# Patient Record
Sex: Female | Born: 1945
Health system: Southern US, Community
[De-identification: ages and names within clinical notes are randomized; demographics above are authoritative.]

## PROBLEM LIST (undated history)

## (undated) DIAGNOSIS — M1990 Unspecified osteoarthritis, unspecified site: Secondary | ICD-10-CM

## (undated) DIAGNOSIS — D649 Anemia, unspecified: Secondary | ICD-10-CM

## (undated) DIAGNOSIS — I10 Essential (primary) hypertension: Secondary | ICD-10-CM

## (undated) DIAGNOSIS — C801 Malignant (primary) neoplasm, unspecified: Secondary | ICD-10-CM

## (undated) HISTORY — PX: EYE SURGERY: SHX253

## (undated) HISTORY — PX: TUBAL LIGATION: SHX77

## (undated) HISTORY — PX: CATARACT EXTRACTION W/ INTRAOCULAR LENS IMPLANT: SHX1309

## (undated) HISTORY — PX: BUNIONECTOMY: SHX129

---

## 1998-12-04 ENCOUNTER — Emergency Department (HOSPITAL_COMMUNITY): Admission: EM | Admit: 1998-12-04 | Discharge: 1998-12-04 | Payer: Self-pay | Admitting: Emergency Medicine

## 1999-11-22 ENCOUNTER — Encounter: Admission: RE | Admit: 1999-11-22 | Discharge: 1999-11-22 | Payer: Self-pay | Admitting: Family Medicine

## 1999-11-22 ENCOUNTER — Encounter: Payer: Self-pay | Admitting: Family Medicine

## 1999-12-18 ENCOUNTER — Other Ambulatory Visit: Admission: RE | Admit: 1999-12-18 | Discharge: 1999-12-18 | Payer: Self-pay | Admitting: *Deleted

## 1999-12-31 ENCOUNTER — Encounter: Admission: RE | Admit: 1999-12-31 | Discharge: 1999-12-31 | Payer: Self-pay | Admitting: Obstetrics and Gynecology

## 1999-12-31 ENCOUNTER — Encounter: Payer: Self-pay | Admitting: Obstetrics and Gynecology

## 2001-04-30 ENCOUNTER — Emergency Department (HOSPITAL_COMMUNITY): Admission: EM | Admit: 2001-04-30 | Discharge: 2001-04-30 | Payer: Self-pay | Admitting: Emergency Medicine

## 2002-01-25 ENCOUNTER — Other Ambulatory Visit: Admission: RE | Admit: 2002-01-25 | Discharge: 2002-01-25 | Payer: Self-pay | Admitting: Obstetrics and Gynecology

## 2003-02-16 ENCOUNTER — Other Ambulatory Visit: Admission: RE | Admit: 2003-02-16 | Discharge: 2003-02-16 | Payer: Self-pay | Admitting: Obstetrics and Gynecology

## 2004-05-17 ENCOUNTER — Other Ambulatory Visit: Admission: RE | Admit: 2004-05-17 | Discharge: 2004-05-17 | Payer: Self-pay | Admitting: Obstetrics and Gynecology

## 2005-07-24 ENCOUNTER — Other Ambulatory Visit: Admission: RE | Admit: 2005-07-24 | Discharge: 2005-07-24 | Payer: Self-pay | Admitting: Obstetrics and Gynecology

## 2005-11-20 ENCOUNTER — Encounter: Admission: RE | Admit: 2005-11-20 | Discharge: 2005-11-20 | Payer: Self-pay | Admitting: Emergency Medicine

## 2006-07-28 ENCOUNTER — Other Ambulatory Visit: Admission: RE | Admit: 2006-07-28 | Discharge: 2006-07-28 | Payer: Self-pay | Admitting: Obstetrics and Gynecology

## 2016-07-03 DIAGNOSIS — B880 Other acariasis: Secondary | ICD-10-CM | POA: Diagnosis not present

## 2016-08-08 DIAGNOSIS — N3001 Acute cystitis with hematuria: Secondary | ICD-10-CM | POA: Diagnosis not present

## 2016-08-08 DIAGNOSIS — R030 Elevated blood-pressure reading, without diagnosis of hypertension: Secondary | ICD-10-CM | POA: Diagnosis not present

## 2016-08-08 DIAGNOSIS — B373 Candidiasis of vulva and vagina: Secondary | ICD-10-CM | POA: Diagnosis not present

## 2016-09-01 DIAGNOSIS — I10 Essential (primary) hypertension: Secondary | ICD-10-CM | POA: Diagnosis not present

## 2016-09-01 DIAGNOSIS — Z23 Encounter for immunization: Secondary | ICD-10-CM | POA: Diagnosis not present

## 2016-09-30 DIAGNOSIS — I10 Essential (primary) hypertension: Secondary | ICD-10-CM | POA: Diagnosis not present

## 2016-10-31 DIAGNOSIS — Z08 Encounter for follow-up examination after completed treatment for malignant neoplasm: Secondary | ICD-10-CM | POA: Diagnosis not present

## 2016-10-31 DIAGNOSIS — Z1283 Encounter for screening for malignant neoplasm of skin: Secondary | ICD-10-CM | POA: Diagnosis not present

## 2016-10-31 DIAGNOSIS — L821 Other seborrheic keratosis: Secondary | ICD-10-CM | POA: Diagnosis not present

## 2016-10-31 DIAGNOSIS — Z85820 Personal history of malignant melanoma of skin: Secondary | ICD-10-CM | POA: Diagnosis not present

## 2017-01-08 DIAGNOSIS — Z961 Presence of intraocular lens: Secondary | ICD-10-CM | POA: Diagnosis not present

## 2017-11-05 DIAGNOSIS — Z23 Encounter for immunization: Secondary | ICD-10-CM | POA: Diagnosis not present

## 2017-11-20 DIAGNOSIS — Z08 Encounter for follow-up examination after completed treatment for malignant neoplasm: Secondary | ICD-10-CM | POA: Diagnosis not present

## 2017-11-20 DIAGNOSIS — Z85820 Personal history of malignant melanoma of skin: Secondary | ICD-10-CM | POA: Diagnosis not present

## 2017-11-20 DIAGNOSIS — L570 Actinic keratosis: Secondary | ICD-10-CM | POA: Diagnosis not present

## 2017-11-20 DIAGNOSIS — Z1283 Encounter for screening for malignant neoplasm of skin: Secondary | ICD-10-CM | POA: Diagnosis not present

## 2017-11-20 DIAGNOSIS — L820 Inflamed seborrheic keratosis: Secondary | ICD-10-CM | POA: Diagnosis not present

## 2017-11-20 DIAGNOSIS — X32XXXD Exposure to sunlight, subsequent encounter: Secondary | ICD-10-CM | POA: Diagnosis not present

## 2018-01-13 DIAGNOSIS — H5053 Vertical heterophoria: Secondary | ICD-10-CM | POA: Diagnosis not present

## 2018-04-20 DIAGNOSIS — R030 Elevated blood-pressure reading, without diagnosis of hypertension: Secondary | ICD-10-CM | POA: Diagnosis not present

## 2018-05-04 DIAGNOSIS — Z23 Encounter for immunization: Secondary | ICD-10-CM | POA: Diagnosis not present

## 2018-05-04 DIAGNOSIS — R030 Elevated blood-pressure reading, without diagnosis of hypertension: Secondary | ICD-10-CM | POA: Diagnosis not present

## 2018-05-04 DIAGNOSIS — Z5320 Procedure and treatment not carried out because of patient's decision for unspecified reasons: Secondary | ICD-10-CM | POA: Diagnosis not present

## 2018-08-12 DIAGNOSIS — W19XXXA Unspecified fall, initial encounter: Secondary | ICD-10-CM | POA: Diagnosis not present

## 2018-08-12 DIAGNOSIS — S82832A Other fracture of upper and lower end of left fibula, initial encounter for closed fracture: Secondary | ICD-10-CM | POA: Diagnosis not present

## 2018-08-12 DIAGNOSIS — S93409A Sprain of unspecified ligament of unspecified ankle, initial encounter: Secondary | ICD-10-CM | POA: Diagnosis not present

## 2018-08-17 DIAGNOSIS — S82832D Other fracture of upper and lower end of left fibula, subsequent encounter for closed fracture with routine healing: Secondary | ICD-10-CM | POA: Diagnosis not present

## 2018-08-23 DIAGNOSIS — M25572 Pain in left ankle and joints of left foot: Secondary | ICD-10-CM | POA: Diagnosis not present

## 2018-08-26 DIAGNOSIS — M25572 Pain in left ankle and joints of left foot: Secondary | ICD-10-CM | POA: Diagnosis not present

## 2018-08-30 DIAGNOSIS — M25572 Pain in left ankle and joints of left foot: Secondary | ICD-10-CM | POA: Diagnosis not present

## 2018-09-03 DIAGNOSIS — M25572 Pain in left ankle and joints of left foot: Secondary | ICD-10-CM | POA: Diagnosis not present

## 2018-09-06 DIAGNOSIS — M25572 Pain in left ankle and joints of left foot: Secondary | ICD-10-CM | POA: Diagnosis not present

## 2018-09-09 DIAGNOSIS — M25579 Pain in unspecified ankle and joints of unspecified foot: Secondary | ICD-10-CM | POA: Diagnosis not present

## 2018-09-14 DIAGNOSIS — M25572 Pain in left ankle and joints of left foot: Secondary | ICD-10-CM | POA: Diagnosis not present

## 2018-09-16 DIAGNOSIS — M25572 Pain in left ankle and joints of left foot: Secondary | ICD-10-CM | POA: Diagnosis not present

## 2018-09-21 DIAGNOSIS — S82832D Other fracture of upper and lower end of left fibula, subsequent encounter for closed fracture with routine healing: Secondary | ICD-10-CM | POA: Diagnosis not present

## 2018-09-24 DIAGNOSIS — M25572 Pain in left ankle and joints of left foot: Secondary | ICD-10-CM | POA: Diagnosis not present

## 2018-09-27 DIAGNOSIS — M25572 Pain in left ankle and joints of left foot: Secondary | ICD-10-CM | POA: Diagnosis not present

## 2018-09-29 DIAGNOSIS — M25572 Pain in left ankle and joints of left foot: Secondary | ICD-10-CM | POA: Diagnosis not present

## 2018-10-04 DIAGNOSIS — M25572 Pain in left ankle and joints of left foot: Secondary | ICD-10-CM | POA: Diagnosis not present

## 2018-10-06 DIAGNOSIS — M25572 Pain in left ankle and joints of left foot: Secondary | ICD-10-CM | POA: Diagnosis not present

## 2018-10-11 DIAGNOSIS — M25579 Pain in unspecified ankle and joints of unspecified foot: Secondary | ICD-10-CM | POA: Diagnosis not present

## 2018-10-12 DIAGNOSIS — Z23 Encounter for immunization: Secondary | ICD-10-CM | POA: Diagnosis not present

## 2018-11-23 DIAGNOSIS — Z08 Encounter for follow-up examination after completed treatment for malignant neoplasm: Secondary | ICD-10-CM | POA: Diagnosis not present

## 2018-11-23 DIAGNOSIS — Z1283 Encounter for screening for malignant neoplasm of skin: Secondary | ICD-10-CM | POA: Diagnosis not present

## 2018-11-23 DIAGNOSIS — Z85820 Personal history of malignant melanoma of skin: Secondary | ICD-10-CM | POA: Diagnosis not present

## 2018-11-23 DIAGNOSIS — D225 Melanocytic nevi of trunk: Secondary | ICD-10-CM | POA: Diagnosis not present

## 2019-01-21 DIAGNOSIS — M25572 Pain in left ankle and joints of left foot: Secondary | ICD-10-CM | POA: Diagnosis not present

## 2019-01-21 DIAGNOSIS — M25571 Pain in right ankle and joints of right foot: Secondary | ICD-10-CM | POA: Diagnosis not present

## 2019-04-27 DIAGNOSIS — Z961 Presence of intraocular lens: Secondary | ICD-10-CM | POA: Diagnosis not present

## 2019-05-05 ENCOUNTER — Other Ambulatory Visit: Payer: Self-pay | Admitting: Family Medicine

## 2019-05-05 DIAGNOSIS — Z1231 Encounter for screening mammogram for malignant neoplasm of breast: Secondary | ICD-10-CM

## 2019-05-09 ENCOUNTER — Ambulatory Visit
Admission: RE | Admit: 2019-05-09 | Discharge: 2019-05-09 | Disposition: A | Discharge status: Home / Self Care | Payer: Medicare Other | Source: Ambulatory Visit | Attending: Family Medicine | Admitting: Family Medicine

## 2019-05-09 ENCOUNTER — Other Ambulatory Visit: Payer: Self-pay

## 2019-05-09 DIAGNOSIS — Z1231 Encounter for screening mammogram for malignant neoplasm of breast: Secondary | ICD-10-CM | POA: Diagnosis not present

## 2019-06-01 DIAGNOSIS — Z1211 Encounter for screening for malignant neoplasm of colon: Secondary | ICD-10-CM | POA: Diagnosis not present

## 2019-06-01 DIAGNOSIS — Z13220 Encounter for screening for lipoid disorders: Secondary | ICD-10-CM | POA: Diagnosis not present

## 2019-06-01 DIAGNOSIS — Z23 Encounter for immunization: Secondary | ICD-10-CM | POA: Diagnosis not present

## 2019-06-01 DIAGNOSIS — Z136 Encounter for screening for cardiovascular disorders: Secondary | ICD-10-CM | POA: Diagnosis not present

## 2019-06-01 DIAGNOSIS — M25511 Pain in right shoulder: Secondary | ICD-10-CM | POA: Diagnosis not present

## 2019-06-01 DIAGNOSIS — Z0000 Encounter for general adult medical examination without abnormal findings: Secondary | ICD-10-CM | POA: Diagnosis not present

## 2019-06-16 DIAGNOSIS — M25511 Pain in right shoulder: Secondary | ICD-10-CM | POA: Diagnosis not present

## 2019-08-08 DIAGNOSIS — Z79899 Other long term (current) drug therapy: Secondary | ICD-10-CM | POA: Diagnosis not present

## 2019-08-08 DIAGNOSIS — E7800 Pure hypercholesterolemia, unspecified: Secondary | ICD-10-CM | POA: Diagnosis not present

## 2019-09-02 DIAGNOSIS — I872 Venous insufficiency (chronic) (peripheral): Secondary | ICD-10-CM | POA: Diagnosis not present

## 2019-11-04 DIAGNOSIS — E7800 Pure hypercholesterolemia, unspecified: Secondary | ICD-10-CM | POA: Diagnosis not present

## 2019-11-04 DIAGNOSIS — Z79899 Other long term (current) drug therapy: Secondary | ICD-10-CM | POA: Diagnosis not present

## 2019-12-13 DIAGNOSIS — L820 Inflamed seborrheic keratosis: Secondary | ICD-10-CM | POA: Diagnosis not present

## 2019-12-13 DIAGNOSIS — D225 Melanocytic nevi of trunk: Secondary | ICD-10-CM | POA: Diagnosis not present

## 2019-12-13 DIAGNOSIS — Z85820 Personal history of malignant melanoma of skin: Secondary | ICD-10-CM | POA: Diagnosis not present

## 2019-12-13 DIAGNOSIS — Z1283 Encounter for screening for malignant neoplasm of skin: Secondary | ICD-10-CM | POA: Diagnosis not present

## 2019-12-13 DIAGNOSIS — Z08 Encounter for follow-up examination after completed treatment for malignant neoplasm: Secondary | ICD-10-CM | POA: Diagnosis not present

## 2020-01-24 DIAGNOSIS — C44319 Basal cell carcinoma of skin of other parts of face: Secondary | ICD-10-CM | POA: Diagnosis not present

## 2020-02-21 DIAGNOSIS — Z1283 Encounter for screening for malignant neoplasm of skin: Secondary | ICD-10-CM | POA: Diagnosis not present

## 2020-02-21 DIAGNOSIS — L905 Scar conditions and fibrosis of skin: Secondary | ICD-10-CM | POA: Diagnosis not present

## 2020-02-21 DIAGNOSIS — D485 Neoplasm of uncertain behavior of skin: Secondary | ICD-10-CM | POA: Diagnosis not present

## 2020-02-23 ENCOUNTER — Ambulatory Visit: Payer: Medicare Other | Attending: Internal Medicine

## 2020-02-23 DIAGNOSIS — Z23 Encounter for immunization: Secondary | ICD-10-CM

## 2020-02-23 NOTE — Progress Notes (Signed)
   Covid-19 Vaccination Clinic  Name:  Kristin Richmond    MRN: [id number] DOB: Jul 11, 1946  02/23/2020  [name] was observed post Covid-19 immunization for 15 minutes without incident. She was provided with Vaccine Information Sheet and instruction to access the V-Safe system.   [name] was instructed to call 911 with any severe reactions post vaccine: Marland Kitchen Difficulty breathing  . Swelling of face and throat  . A fast heartbeat  . A bad rash all over body  . Dizziness and weakness   Immunizations Administered    Name Date Dose VIS Date Route   Pfizer COVID-19 Vaccine 02/23/2020  9:53 AM 0.3 mL 10/28/2019 Intramuscular   Manufacturer: [place]   Lot: [id number]   [place]: [id number]

## 2020-03-19 ENCOUNTER — Ambulatory Visit: Payer: Medicare Other | Attending: Internal Medicine

## 2020-03-19 DIAGNOSIS — Z23 Encounter for immunization: Secondary | ICD-10-CM

## 2020-03-19 NOTE — Progress Notes (Signed)
   Covid-19 Vaccination Clinic  Name:  Kristin Richmond    MRN: [id number] DOB: 01-15-1946  03/19/2020  Kristin Richmond was observed post Covid-19 immunization for 15 minutes without incident. She was provided with Vaccine Information Sheet and instruction to access the V-Safe system.   Kristin Richmond was instructed to call 911 with any severe reactions post vaccine: Marland Kitchen Difficulty breathing  . Swelling of face and throat  . A fast heartbeat  . A bad rash all over body  . Dizziness and weakness   Immunizations Administered    Name Date Dose VIS Date Route   Pfizer COVID-19 Vaccine 03/19/2020  8:51 AM 0.3 mL 01/11/2019 Intramuscular   Manufacturer: [place]   Lot: [id number]   [place]: [id number]

## 2020-03-27 DIAGNOSIS — Z08 Encounter for follow-up examination after completed treatment for malignant neoplasm: Secondary | ICD-10-CM | POA: Diagnosis not present

## 2020-03-27 DIAGNOSIS — Z85828 Personal history of other malignant neoplasm of skin: Secondary | ICD-10-CM | POA: Diagnosis not present

## 2020-04-25 ENCOUNTER — Other Ambulatory Visit: Payer: Self-pay | Admitting: Family Medicine

## 2020-04-25 DIAGNOSIS — Z1231 Encounter for screening mammogram for malignant neoplasm of breast: Secondary | ICD-10-CM

## 2020-05-03 DIAGNOSIS — H26491 Other secondary cataract, right eye: Secondary | ICD-10-CM | POA: Diagnosis not present

## 2020-05-03 DIAGNOSIS — Z961 Presence of intraocular lens: Secondary | ICD-10-CM | POA: Diagnosis not present

## 2020-05-04 DIAGNOSIS — M19011 Primary osteoarthritis, right shoulder: Secondary | ICD-10-CM | POA: Diagnosis not present

## 2020-05-04 DIAGNOSIS — M25511 Pain in right shoulder: Secondary | ICD-10-CM | POA: Diagnosis not present

## 2020-05-31 ENCOUNTER — Ambulatory Visit: Payer: Medicare Other

## 2020-05-31 ENCOUNTER — Other Ambulatory Visit: Payer: Self-pay

## 2020-05-31 ENCOUNTER — Ambulatory Visit
Admission: RE | Admit: 2020-05-31 | Discharge: 2020-05-31 | Disposition: A | Discharge status: Home / Self Care | Payer: Medicare Other | Source: Ambulatory Visit | Attending: Family Medicine | Admitting: Family Medicine

## 2020-05-31 DIAGNOSIS — Z1231 Encounter for screening mammogram for malignant neoplasm of breast: Secondary | ICD-10-CM | POA: Diagnosis not present

## 2020-06-01 DIAGNOSIS — Z136 Encounter for screening for cardiovascular disorders: Secondary | ICD-10-CM | POA: Diagnosis not present

## 2020-06-01 DIAGNOSIS — Z23 Encounter for immunization: Secondary | ICD-10-CM | POA: Diagnosis not present

## 2020-06-01 DIAGNOSIS — Z1211 Encounter for screening for malignant neoplasm of colon: Secondary | ICD-10-CM | POA: Diagnosis not present

## 2020-06-01 DIAGNOSIS — Z0000 Encounter for general adult medical examination without abnormal findings: Secondary | ICD-10-CM | POA: Diagnosis not present

## 2020-06-11 DIAGNOSIS — Z1211 Encounter for screening for malignant neoplasm of colon: Secondary | ICD-10-CM | POA: Diagnosis not present

## 2020-07-05 DIAGNOSIS — M25511 Pain in right shoulder: Secondary | ICD-10-CM | POA: Diagnosis not present

## 2020-07-05 DIAGNOSIS — M19011 Primary osteoarthritis, right shoulder: Secondary | ICD-10-CM | POA: Diagnosis not present

## 2020-07-10 DIAGNOSIS — Z85828 Personal history of other malignant neoplasm of skin: Secondary | ICD-10-CM | POA: Diagnosis not present

## 2020-07-10 DIAGNOSIS — Z08 Encounter for follow-up examination after completed treatment for malignant neoplasm: Secondary | ICD-10-CM | POA: Diagnosis not present

## 2020-08-23 DIAGNOSIS — Z23 Encounter for immunization: Secondary | ICD-10-CM | POA: Diagnosis not present

## 2020-11-02 DIAGNOSIS — Z23 Encounter for immunization: Secondary | ICD-10-CM | POA: Diagnosis not present

## 2021-01-16 DIAGNOSIS — M25572 Pain in left ankle and joints of left foot: Secondary | ICD-10-CM | POA: Diagnosis not present

## 2021-01-16 DIAGNOSIS — M25561 Pain in right knee: Secondary | ICD-10-CM | POA: Diagnosis not present

## 2021-01-31 DIAGNOSIS — M25572 Pain in left ankle and joints of left foot: Secondary | ICD-10-CM | POA: Diagnosis not present

## 2021-03-12 DIAGNOSIS — M19011 Primary osteoarthritis, right shoulder: Secondary | ICD-10-CM | POA: Diagnosis not present

## 2021-04-05 DIAGNOSIS — M19011 Primary osteoarthritis, right shoulder: Secondary | ICD-10-CM | POA: Diagnosis not present

## 2021-04-08 ENCOUNTER — Other Ambulatory Visit: Payer: Self-pay | Admitting: Orthopedic Surgery

## 2021-04-08 DIAGNOSIS — M19011 Primary osteoarthritis, right shoulder: Secondary | ICD-10-CM

## 2021-04-19 ENCOUNTER — Other Ambulatory Visit: Payer: Self-pay | Admitting: Family Medicine

## 2021-04-19 DIAGNOSIS — Z1231 Encounter for screening mammogram for malignant neoplasm of breast: Secondary | ICD-10-CM

## 2021-04-22 ENCOUNTER — Ambulatory Visit
Admission: RE | Admit: 2021-04-22 | Discharge: 2021-04-22 | Disposition: A | Discharge status: Home / Self Care | Payer: Medicare Other | Source: Ambulatory Visit | Attending: Orthopedic Surgery | Admitting: Orthopedic Surgery

## 2021-04-22 DIAGNOSIS — Z01818 Encounter for other preprocedural examination: Secondary | ICD-10-CM | POA: Diagnosis not present

## 2021-04-22 DIAGNOSIS — M19011 Primary osteoarthritis, right shoulder: Secondary | ICD-10-CM

## 2021-04-22 DIAGNOSIS — M25711 Osteophyte, right shoulder: Secondary | ICD-10-CM | POA: Diagnosis not present

## 2021-05-10 DIAGNOSIS — Z01810 Encounter for preprocedural cardiovascular examination: Secondary | ICD-10-CM | POA: Diagnosis not present

## 2021-05-10 DIAGNOSIS — M19011 Primary osteoarthritis, right shoulder: Secondary | ICD-10-CM | POA: Diagnosis not present

## 2021-05-10 DIAGNOSIS — E782 Mixed hyperlipidemia: Secondary | ICD-10-CM | POA: Diagnosis not present

## 2021-05-10 DIAGNOSIS — Z1211 Encounter for screening for malignant neoplasm of colon: Secondary | ICD-10-CM | POA: Diagnosis not present

## 2021-05-21 DIAGNOSIS — D225 Melanocytic nevi of trunk: Secondary | ICD-10-CM | POA: Diagnosis not present

## 2021-05-21 DIAGNOSIS — Z08 Encounter for follow-up examination after completed treatment for malignant neoplasm: Secondary | ICD-10-CM | POA: Diagnosis not present

## 2021-05-21 DIAGNOSIS — Z85828 Personal history of other malignant neoplasm of skin: Secondary | ICD-10-CM | POA: Diagnosis not present

## 2021-05-21 DIAGNOSIS — Z1283 Encounter for screening for malignant neoplasm of skin: Secondary | ICD-10-CM | POA: Diagnosis not present

## 2021-05-21 DIAGNOSIS — Z85820 Personal history of malignant melanoma of skin: Secondary | ICD-10-CM | POA: Diagnosis not present

## 2021-06-03 NOTE — Patient Instructions (Addendum)
DUE TO COVID-19 ONLY ONE VISITOR IS ALLOWED TO COME WITH YOU AND STAY IN THE WAITING ROOM ONLY DURING PRE OP AND PROCEDURE DAY OF SURGERY. THE 1 VISITOR  MAY VISIT WITH YOU AFTER SURGERY IN YOUR PRIVATE ROOM DURING VISITING HOURS ONLY!  YOU NEED TO HAVE A COVID 19 TEST ON: 06/11/21 @ 9:00 AM , THIS TEST MUST BE DONE BEFORE SURGERY,  COVID TESTING SITE [place] [id number], IT IS ON THE RIGHT GOING OUT [address] APPROXIMATELY  2 MINUTES PAST [employer] ON THE RIGHT. ONCE YOUR COVID TEST IS COMPLETED,  PLEASE BEGIN THE QUARANTINE INSTRUCTIONS AS OUTLINED IN YOUR HANDOUT.                Kristin Richmond   Your procedure is scheduled on: 06/14/21   Report to [facility] Main  Entrance   Report to admitting at : 10:00 AM    Call this number if you have problems the morning of surgery [phone]    Remember: NO SOLID FOOD AFTER MIDNIGHT THE NIGHT PRIOR TO SURGERY. NOTHING BY MOUTH EXCEPT CLEAR LIQUIDS UNTIL: 9:30 AM . PLEASE FINISH ENSURE DRINK PER SURGEON ORDER  WHICH NEEDS TO BE COMPLETED AT : 9:30 AM.   CLEAR LIQUID DIET  Foods Allowed                                                                     Foods Excluded  Coffee and tea, regular and decaf                             liquids that you cannot  Plain Jell-O any favor except red or purple                                           see through such as: Fruit ices (not with fruit pulp)                                     milk, soups, orange juice  Iced Popsicles                                    All solid food Carbonated beverages, regular and diet                                    Cranberry, grape and [place] juices Sports drinks like Gatorade Lightly seasoned clear broth or consume(fat free) Sugar, honey syrup  Sample Menu Breakfast                                Lunch  Supper Cranberry juice                    Beef broth                             Chicken broth Jell-O                                     Grape juice                           Dimitrov juice Coffee or tea                        Jell-O                                      Popsicle                                                Coffee or tea                        Coffee or tea  _____________________________________________________________________   BRUSH YOUR TEETH MORNING OF SURGERY AND RINSE YOUR MOUTH OUT, NO CHEWING GUM CANDY OR MINTS.    Take these medicines the morning of surgery with A SIP OF WATER: N/A                                You may not have any metal on your body including hair pins and              piercings  Do not wear jewelry, make-up, lotions, powders or perfumes, deodorant             Do not wear nail polish on your fingernails.  Do not shave  48 hours prior to surgery.    Do not bring valuables to the hospital. [place].  Contacts, dentures or bridgework may not be worn into surgery.  Leave suitcase in the car. After surgery it may be brought to your room.     Patients discharged the day of surgery will not be allowed to drive home. IF YOU ARE HAVING SURGERY AND GOING HOME THE SAME DAY, YOU MUST HAVE AN ADULT TO DRIVE YOU HOME AND BE WITH YOU FOR 24 HOURS. YOU MAY GO HOME BY TAXI OR UBER OR ORTHERWISE, BUT AN ADULT MUST ACCOMPANY YOU HOME AND STAY WITH YOU FOR 24 HOURS.  Name and phone number of your driver:  Special Instructions: N/A              Please read over the following fact sheets you were given: _____________________________________________________________________           [facility] - Preparing for Surgery Before surgery, you can play an important role.  Because skin is not sterile, your skin needs to be as free of germs as possible.  You can reduce the number of germs on your skin by washing with CHG (chlorahexidine gluconate) soap before surgery.  CHG is an antiseptic cleaner  which kills germs and bonds with the skin to continue killing germs even after washing. Please DO NOT use if you have an allergy to CHG or antibacterial soaps.  If your skin becomes reddened/irritated stop using the CHG and inform your nurse when you arrive at Short Stay. Do not shave (including legs and underarms) for at least 48 hours prior to the first CHG shower.  You may shave your face/neck. Please follow these instructions carefully:  1.  Shower with CHG Soap the night before surgery and the  morning of Surgery.  2.  If you choose to wash your hair, wash your hair first as usual with your  normal  shampoo.  3.  After you shampoo, rinse your hair and body thoroughly to remove the  shampoo.                           4.  Use CHG as you would any other liquid soap.  You can apply chg directly  to the skin and wash                       Gently with a scrungie or clean washcloth.  5.  Apply the CHG Soap to your body ONLY FROM THE NECK DOWN.   Do not use on face/ open                           Wound or open sores. Avoid contact with eyes, ears mouth and genitals (private parts).                       Wash face,  Genitals (private parts) with your normal soap.             6.  Wash thoroughly, paying special attention to the area where your surgery  will be performed.  7.  Thoroughly rinse your body with warm water from the neck down.  8.  DO NOT shower/wash with your normal soap after using and rinsing off  the CHG Soap.                9.  Pat yourself dry with a clean towel.            10.  Wear clean pajamas.            11.  Place clean sheets on your bed the night of your first shower and do not  sleep with pets. Day of Surgery : Do not apply any lotions/deodorants the morning of surgery.  Please wear clean clothes to the hospital/surgery center.  FAILURE TO FOLLOW THESE INSTRUCTIONS MAY RESULT IN THE CANCELLATION OF YOUR SURGERY PATIENT SIGNATURE_________________________________  NURSE  SIGNATURE__________________________________  ________________________________________________________________________    [facility]- Preparing for Total Shoulder Arthroplasty    Before surgery, you can play an important role. Because skin is not sterile, your skin needs to be as free of germs as possible. You can reduce the number of germs on your skin by using the following products. Benzoyl Peroxide Gel Reduces the number of germs present on the skin Applied twice a day to shoulder area starting two days before surgery    ==================================================================  Please follow these instructions carefully:  BENZOYL PEROXIDE  5% GEL  Please do not use if you have an allergy to benzoyl peroxide.   If your skin becomes reddened/irritated stop using the benzoyl peroxide.  Starting two days before surgery, apply as follows: Apply benzoyl peroxide in the morning and at night. Apply after taking a shower. If you are not taking a shower clean entire shoulder front, back, and side along with the armpit with a clean wet washcloth.  Place a quarter-sized dollop on your shoulder and rub in thoroughly, making sure to cover the front, back, and side of your shoulder, along with the armpit.   2 days before ____ AM   ____ PM              1 day before ____ AM   ____ PM                         Do this twice a day for two days.  (Last application is the night before surgery, AFTER using the CHG soap as described below).  Do NOT apply benzoyl peroxide gel on the day of surgery.       Incentive Spirometer  An incentive spirometer is a tool that can help keep your lungs clear and active. This tool measures how well you are filling your lungs with each breath. Taking long deep breaths may help reverse or decrease the chance of developing breathing (pulmonary) problems (especially infection) following: A long period of time when you are unable to move or be active. BEFORE  THE PROCEDURE  If the spirometer includes an indicator to show your best effort, your nurse or respiratory therapist will set it to a desired goal. If possible, sit up straight or lean slightly forward. Try not to slouch. Hold the incentive spirometer in an upright position. INSTRUCTIONS FOR USE  Sit on the edge of your bed if possible, or sit up as far as you can in bed or on a chair. Hold the incentive spirometer in an upright position. Breathe out normally. Place the mouthpiece in your mouth and seal your lips tightly around it. Breathe in slowly and as deeply as possible, raising the piston or the ball toward the top of the column. Hold your breath for 3-5 seconds or for as long as possible. Allow the piston or ball to fall to the bottom of the column. Remove the mouthpiece from your mouth and breathe out normally. Rest for a few seconds and repeat Steps 1 through 7 at least 10 times every 1-2 hours when you are awake. Take your time and take a few normal breaths between deep breaths. The spirometer may include an indicator to show your best effort. Use the indicator as a goal to work toward during each repetition. After each set of 10 deep breaths, practice coughing to be sure your lungs are clear. If you have an incision (the cut made at the time of surgery), support your incision when coughing by placing a pillow or rolled up towels firmly against it. Once you are able to get out of bed, walk around indoors and cough well. You may stop using the incentive spirometer when instructed by your caregiver.  RISKS AND COMPLICATIONS Take your time so you do not get dizzy or light-headed. If you are in pain, you may need to take or ask for pain medication before doing incentive spirometry. It is harder to take a deep breath if you are having pain. AFTER USE Rest and breathe  slowly and easily. It can be helpful to keep track of a log of your progress. Your caregiver can provide you with a simple  table to help with this. If you are using the spirometer at home, follow these instructions: [place] IF:  You are having difficultly using the spirometer. You have trouble using the spirometer as often as instructed. Your pain medication is not giving enough relief while using the spirometer. You develop fever of 100.5 F (38.1 C) or higher. SEEK IMMEDIATE MEDICAL CARE IF:  You cough up bloody sputum that had not been present before. You develop fever of 102 F (38.9 C) or greater. You develop worsening pain at or near the incision site. MAKE SURE YOU:  Understand these instructions. Will watch your condition. Will get help right away if you are not doing well or get worse. Document Released: 03/16/2007 Document Revised: 01/26/2012 Document Reviewed: 05/17/2007 [facility] Patient Information 2014 [place], [place].   ________________________________________________________________________

## 2021-06-04 ENCOUNTER — Encounter (HOSPITAL_COMMUNITY): Payer: Self-pay

## 2021-06-04 ENCOUNTER — Encounter (HOSPITAL_COMMUNITY)
Admission: RE | Admit: 2021-06-04 | Discharge: 2021-06-04 | Disposition: A | Discharge status: Home / Self Care | Payer: Medicare Other | Source: Ambulatory Visit | Attending: Orthopedic Surgery | Admitting: Orthopedic Surgery

## 2021-06-04 ENCOUNTER — Other Ambulatory Visit: Payer: Self-pay

## 2021-06-04 DIAGNOSIS — Z01812 Encounter for preprocedural laboratory examination: Secondary | ICD-10-CM | POA: Diagnosis not present

## 2021-06-04 HISTORY — DX: Unspecified osteoarthritis, unspecified site: M19.90

## 2021-06-04 HISTORY — DX: Malignant (primary) neoplasm, unspecified: C80.1

## 2021-06-04 HISTORY — DX: Anemia, unspecified: D64.9

## 2021-06-04 LAB — CBC
HCT: 45.8 % (ref 36.0–46.0)
Hemoglobin: 14.8 g/dL (ref 12.0–15.0)
MCH: 28.8 pg (ref 26.0–34.0)
MCHC: 32.3 g/dL (ref 30.0–36.0)
MCV: 89.1 fL (ref 80.0–100.0)
Platelets: 394 10*3/uL (ref 150–400)
RBC: 5.14 MIL/uL — ABNORMAL HIGH (ref 3.87–5.11)
RDW: 14.5 % (ref 11.5–15.5)
WBC: 7.1 10*3/uL (ref 4.0–10.5)
nRBC: 0 % (ref 0.0–0.2)

## 2021-06-04 LAB — SURGICAL PCR SCREEN
MRSA, PCR: NEGATIVE
Staphylococcus aureus: NEGATIVE

## 2021-06-04 NOTE — Progress Notes (Signed)
COVID Vaccine Completed: Yes Date COVID Vaccine completed: 09/2020 [place] COVID vaccine manufacturer: Pfizer      PCP - Dr. Aura Dials Cardiologist -   Chest x-ray -  EKG -  Stress Test -  ECHO -  Cardiac Cath -  Pacemaker/ICD device last checked:  Sleep Study -  CPAP -   Fasting Blood Sugar -  Checks Blood Sugar _____ times a day  Blood Thinner Instructions: Aspirin Instructions: Last Dose:  Anesthesia review:   Patient denies shortness of breath, fever, cough and chest pain at PAT appointment   Patient verbalized understanding of instructions that were given to them at the PAT appointment. Patient was also instructed that they will need to review over the PAT instructions again at home before surgery.

## 2021-06-11 ENCOUNTER — Other Ambulatory Visit (HOSPITAL_COMMUNITY)
Admission: RE | Admit: 2021-06-11 | Discharge: 2021-06-11 | Disposition: A | Discharge status: Home / Self Care | Payer: Medicare Other | Source: Ambulatory Visit | Attending: Orthopedic Surgery | Admitting: Orthopedic Surgery

## 2021-06-11 DIAGNOSIS — Z01812 Encounter for preprocedural laboratory examination: Secondary | ICD-10-CM | POA: Diagnosis not present

## 2021-06-11 DIAGNOSIS — Z20822 Contact with and (suspected) exposure to covid-19: Secondary | ICD-10-CM | POA: Insufficient documentation

## 2021-06-11 LAB — SARS CORONAVIRUS 2 (TAT 6-24 HRS): SARS Coronavirus 2: NEGATIVE

## 2021-06-14 ENCOUNTER — Other Ambulatory Visit: Payer: Self-pay

## 2021-06-14 ENCOUNTER — Encounter (HOSPITAL_COMMUNITY): Payer: Self-pay | Admitting: Orthopedic Surgery

## 2021-06-14 ENCOUNTER — Observation Stay (HOSPITAL_COMMUNITY)
Admission: RE | Admit: 2021-06-14 | Discharge: 2021-06-15 | Disposition: A | Discharge status: Home / Self Care | Payer: Medicare Other | Source: Ambulatory Visit | Attending: Orthopedic Surgery | Admitting: Orthopedic Surgery

## 2021-06-14 ENCOUNTER — Ambulatory Visit (HOSPITAL_COMMUNITY): Payer: Medicare Other | Admitting: Certified Registered Nurse Anesthetist

## 2021-06-14 ENCOUNTER — Observation Stay (HOSPITAL_COMMUNITY): Payer: Medicare Other

## 2021-06-14 ENCOUNTER — Encounter (HOSPITAL_COMMUNITY)
Admission: RE | Disposition: A | Discharge status: Home / Self Care | Payer: Self-pay | Source: Ambulatory Visit | Attending: Orthopedic Surgery

## 2021-06-14 DIAGNOSIS — G8918 Other acute postprocedural pain: Secondary | ICD-10-CM | POA: Diagnosis not present

## 2021-06-14 DIAGNOSIS — Z96611 Presence of right artificial shoulder joint: Secondary | ICD-10-CM | POA: Diagnosis not present

## 2021-06-14 DIAGNOSIS — Z471 Aftercare following joint replacement surgery: Secondary | ICD-10-CM | POA: Diagnosis not present

## 2021-06-14 DIAGNOSIS — M19011 Primary osteoarthritis, right shoulder: Principal | ICD-10-CM | POA: Insufficient documentation

## 2021-06-14 DIAGNOSIS — R600 Localized edema: Secondary | ICD-10-CM | POA: Diagnosis not present

## 2021-06-14 DIAGNOSIS — Z859 Personal history of malignant neoplasm, unspecified: Secondary | ICD-10-CM | POA: Diagnosis not present

## 2021-06-14 DIAGNOSIS — D649 Anemia, unspecified: Secondary | ICD-10-CM | POA: Diagnosis not present

## 2021-06-14 HISTORY — PX: REVERSE SHOULDER ARTHROPLASTY: SHX5054

## 2021-06-14 SURGERY — ARTHROPLASTY, SHOULDER, TOTAL, REVERSE
Anesthesia: General | Site: Shoulder | Laterality: Right

## 2021-06-14 MED ORDER — EZETIMIBE 10 MG PO TABS
10.0000 mg | ORAL_TABLET | Freq: Every morning | ORAL | Status: DC
  Administered 2021-06-15: 10 mg via ORAL
  Filled 2021-06-14: qty 1

## 2021-06-14 MED ORDER — LIDOCAINE HCL (CARDIAC) PF 100 MG/5ML IV SOSY
PREFILLED_SYRINGE | INTRAVENOUS | Status: DC | PRN
  Administered 2021-06-14: 100 mg via INTRAVENOUS

## 2021-06-14 MED ORDER — HYDROCODONE-ACETAMINOPHEN 5-325 MG PO TABS
1.0000 | ORAL_TABLET | ORAL | Status: DC | PRN
  Administered 2021-06-15: 1 via ORAL
  Filled 2021-06-14: qty 1

## 2021-06-14 MED ORDER — CEFAZOLIN SODIUM-DEXTROSE 1-4 GM/50ML-% IV SOLN
1.0000 g | Freq: Four times a day (QID) | INTRAVENOUS | Status: DC
  Administered 2021-06-14 – 2021-06-15 (×2): 1 g via INTRAVENOUS
  Filled 2021-06-14 (×3): qty 50

## 2021-06-14 MED ORDER — ONDANSETRON HCL 4 MG/2ML IJ SOLN
INTRAMUSCULAR | Status: AC
  Filled 2021-06-14: qty 2

## 2021-06-14 MED ORDER — STERILE WATER FOR IRRIGATION IR SOLN
Status: DC | PRN
  Administered 2021-06-14: 2000 mL

## 2021-06-14 MED ORDER — ACETAMINOPHEN 500 MG PO TABS
500.0000 mg | ORAL_TABLET | Freq: Four times a day (QID) | ORAL | Status: DC
Start: 2021-06-14 — End: 2021-06-15
  Administered 2021-06-14 – 2021-06-15 (×3): 500 mg via ORAL
  Filled 2021-06-14 (×3): qty 1

## 2021-06-14 MED ORDER — ASPIRIN EC 325 MG PO TBEC
325.0000 mg | DELAYED_RELEASE_TABLET | Freq: Every day | ORAL | Status: DC
  Administered 2021-06-14 – 2021-06-15 (×2): 325 mg via ORAL
  Filled 2021-06-14: qty 1

## 2021-06-14 MED ORDER — OXYCODONE HCL 5 MG/5ML PO SOLN: 5.0000 mg | Freq: Once | ORAL | Status: DC | PRN

## 2021-06-14 MED ORDER — HYDROCODONE-ACETAMINOPHEN 7.5-325 MG PO TABS: 1.0000 | ORAL_TABLET | ORAL | Status: DC | PRN

## 2021-06-14 MED ORDER — DOCUSATE SODIUM 100 MG PO CAPS
100.0000 mg | ORAL_CAPSULE | Freq: Two times a day (BID) | ORAL | Status: DC
  Administered 2021-06-14 – 2021-06-15 (×2): 100 mg via ORAL
  Filled 2021-06-14 (×2): qty 1

## 2021-06-14 MED ORDER — VANCOMYCIN HCL 1000 MG IV SOLR
INTRAVENOUS | Status: AC
  Filled 2021-06-14: qty 1000

## 2021-06-14 MED ORDER — PROMETHAZINE HCL 25 MG/ML IJ SOLN: 6.2500 mg | INTRAMUSCULAR | Status: DC | PRN

## 2021-06-14 MED ORDER — FENTANYL CITRATE (PF) 100 MCG/2ML IJ SOLN
50.0000 ug | Freq: Once | INTRAMUSCULAR | Status: AC
  Administered 2021-06-14: 50 ug via INTRAVENOUS
  Filled 2021-06-14: qty 2

## 2021-06-14 MED ORDER — CALCIUM CARBONATE-VITAMIN D 500-200 MG-UNIT PO TABS
1.0000 | ORAL_TABLET | Freq: Every morning | ORAL | Status: DC
  Administered 2021-06-15: 1 via ORAL

## 2021-06-14 MED ORDER — FENTANYL CITRATE (PF) 100 MCG/2ML IJ SOLN
INTRAMUSCULAR | Status: DC | PRN
  Administered 2021-06-14: 50 ug via INTRAVENOUS

## 2021-06-14 MED ORDER — ORAL CARE MOUTH RINSE: 15.0000 mL | Freq: Once | OROMUCOSAL | Status: AC

## 2021-06-14 MED ORDER — HYDROCODONE-ACETAMINOPHEN 5-325 MG PO TABS: 2.0000 | ORAL_TABLET | Freq: Four times a day (QID) | ORAL | 0 refills | Status: AC | PRN

## 2021-06-14 MED ORDER — METHOCARBAMOL 500 MG PO TABS: 500.0000 mg | ORAL_TABLET | Freq: Four times a day (QID) | ORAL | 1 refills | Status: AC | PRN

## 2021-06-14 MED ORDER — SODIUM CHLORIDE 0.9 % IV SOLN
2.0000 g | INTRAVENOUS | Status: AC
  Administered 2021-06-14: 2 g via INTRAVENOUS
  Filled 2021-06-14: qty 2

## 2021-06-14 MED ORDER — PHENYLEPHRINE 40 MCG/ML (10ML) SYRINGE FOR IV PUSH (FOR BLOOD PRESSURE SUPPORT)
PREFILLED_SYRINGE | INTRAVENOUS | Status: DC | PRN
  Administered 2021-06-14: 120 ug via INTRAVENOUS
  Administered 2021-06-14: 80 ug via INTRAVENOUS

## 2021-06-14 MED ORDER — PHENYLEPHRINE HCL-NACL 10-0.9 MG/250ML-% IV SOLN
INTRAVENOUS | Status: AC
  Filled 2021-06-14: qty 250

## 2021-06-14 MED ORDER — METOCLOPRAMIDE HCL 5 MG PO TABS: 5.0000 mg | ORAL_TABLET | Freq: Three times a day (TID) | ORAL | Status: DC | PRN

## 2021-06-14 MED ORDER — VANCOMYCIN HCL 1000 MG IV SOLR
INTRAVENOUS | Status: DC | PRN
  Administered 2021-06-14: 1000 mg

## 2021-06-14 MED ORDER — MORPHINE SULFATE (PF) 2 MG/ML IV SOLN: 0.5000 mg | INTRAVENOUS | Status: DC | PRN

## 2021-06-14 MED ORDER — TRANEXAMIC ACID-NACL 1000-0.7 MG/100ML-% IV SOLN
1000.0000 mg | Freq: Once | INTRAVENOUS | Status: AC
  Administered 2021-06-14: 1000 mg via INTRAVENOUS
  Filled 2021-06-14: qty 100

## 2021-06-14 MED ORDER — ACETAMINOPHEN 325 MG PO TABS: 325.0000 mg | ORAL_TABLET | Freq: Four times a day (QID) | ORAL | Status: DC | PRN

## 2021-06-14 MED ORDER — METOCLOPRAMIDE HCL 5 MG/ML IJ SOLN: 5.0000 mg | Freq: Three times a day (TID) | INTRAMUSCULAR | Status: DC | PRN

## 2021-06-14 MED ORDER — LACTATED RINGERS IV SOLN
INTRAVENOUS | Status: DC
Start: 2021-06-14 — End: 2021-06-14

## 2021-06-14 MED ORDER — LIDOCAINE 2% (20 MG/ML) 5 ML SYRINGE
INTRAMUSCULAR | Status: AC
  Filled 2021-06-14: qty 5

## 2021-06-14 MED ORDER — BUPIVACAINE-EPINEPHRINE (PF) 0.5% -1:200000 IJ SOLN
INTRAMUSCULAR | Status: DC | PRN
  Administered 2021-06-14: 15 mL via PERINEURAL

## 2021-06-14 MED ORDER — SUGAMMADEX SODIUM 200 MG/2ML IV SOLN
INTRAVENOUS | Status: DC | PRN
  Administered 2021-06-14: 200 mg via INTRAVENOUS

## 2021-06-14 MED ORDER — MENTHOL 3 MG MT LOZG: 1.0000 | LOZENGE | OROMUCOSAL | Status: DC | PRN

## 2021-06-14 MED ORDER — PHENOL 1.4 % MT LIQD: 1.0000 | OROMUCOSAL | Status: DC | PRN

## 2021-06-14 MED ORDER — ONDANSETRON HCL 4 MG/2ML IJ SOLN
INTRAMUSCULAR | Status: DC | PRN
  Administered 2021-06-14: 4 mg via INTRAVENOUS

## 2021-06-14 MED ORDER — FENTANYL CITRATE (PF) 100 MCG/2ML IJ SOLN
INTRAMUSCULAR | Status: AC
  Administered 2021-06-14: 25 ug via INTRAVENOUS
  Filled 2021-06-14: qty 2

## 2021-06-14 MED ORDER — CHLORHEXIDINE GLUCONATE 0.12 % MT SOLN
15.0000 mL | Freq: Once | OROMUCOSAL | Status: AC
  Administered 2021-06-14: 15 mL via OROMUCOSAL

## 2021-06-14 MED ORDER — PROPOFOL 10 MG/ML IV BOLUS
INTRAVENOUS | Status: AC
  Filled 2021-06-14: qty 20

## 2021-06-14 MED ORDER — ROCURONIUM BROMIDE 10 MG/ML (PF) SYRINGE
PREFILLED_SYRINGE | INTRAVENOUS | Status: DC | PRN
  Administered 2021-06-14: 60 mg via INTRAVENOUS

## 2021-06-14 MED ORDER — ROCURONIUM BROMIDE 10 MG/ML (PF) SYRINGE
PREFILLED_SYRINGE | INTRAVENOUS | Status: AC
  Filled 2021-06-14: qty 10

## 2021-06-14 MED ORDER — PROPOFOL 10 MG/ML IV BOLUS
INTRAVENOUS | Status: DC | PRN
  Administered 2021-06-14: 130 mg via INTRAVENOUS

## 2021-06-14 MED ORDER — 0.9 % SODIUM CHLORIDE (POUR BTL) OPTIME
TOPICAL | Status: DC | PRN
  Administered 2021-06-14: 1000 mL

## 2021-06-14 MED ORDER — NAPROXEN 250 MG PO TABS
500.0000 mg | ORAL_TABLET | Freq: Two times a day (BID) | ORAL | Status: DC
  Administered 2021-06-14 – 2021-06-15 (×2): 500 mg via ORAL
  Filled 2021-06-14 (×2): qty 2

## 2021-06-14 MED ORDER — OXYCODONE HCL 5 MG PO TABS: 5.0000 mg | ORAL_TABLET | Freq: Once | ORAL | Status: DC | PRN

## 2021-06-14 MED ORDER — PHENYLEPHRINE HCL-NACL 10-0.9 MG/250ML-% IV SOLN
INTRAVENOUS | Status: DC | PRN
  Administered 2021-06-14: 25 ug/min via INTRAVENOUS

## 2021-06-14 MED ORDER — ONDANSETRON HCL 4 MG PO TABS: 4.0000 mg | ORAL_TABLET | Freq: Four times a day (QID) | ORAL | Status: DC | PRN

## 2021-06-14 MED ORDER — ACETAMINOPHEN 500 MG PO TABS: 1000.0000 mg | ORAL_TABLET | Freq: Once | ORAL | Status: DC

## 2021-06-14 MED ORDER — ONDANSETRON HCL 4 MG PO TABS: 4.0000 mg | ORAL_TABLET | Freq: Every day | ORAL | 1 refills | Status: AC | PRN

## 2021-06-14 MED ORDER — TRANEXAMIC ACID-NACL 1000-0.7 MG/100ML-% IV SOLN
1000.0000 mg | INTRAVENOUS | Status: AC
  Administered 2021-06-14: 1000 mg via INTRAVENOUS
  Filled 2021-06-14: qty 100

## 2021-06-14 MED ORDER — MIDAZOLAM HCL 2 MG/2ML IJ SOLN
1.0000 mg | Freq: Once | INTRAMUSCULAR | Status: DC
  Filled 2021-06-14: qty 2

## 2021-06-14 MED ORDER — BUPIVACAINE LIPOSOME 1.3 % IJ SUSP
INTRAMUSCULAR | Status: DC | PRN
  Administered 2021-06-14: 10 mL via PERINEURAL

## 2021-06-14 MED ORDER — DEXAMETHASONE SODIUM PHOSPHATE 10 MG/ML IJ SOLN
INTRAMUSCULAR | Status: DC | PRN
  Administered 2021-06-14: 5 mg via INTRAVENOUS

## 2021-06-14 MED ORDER — FENTANYL CITRATE (PF) 100 MCG/2ML IJ SOLN
INTRAMUSCULAR | Status: AC
  Filled 2021-06-14: qty 2

## 2021-06-14 MED ORDER — MORPHINE SULFATE (PF) 4 MG/ML IV SOLN: 0.5000 mg | INTRAVENOUS | Status: DC | PRN

## 2021-06-14 MED ORDER — ONDANSETRON HCL 4 MG/2ML IJ SOLN: 4.0000 mg | Freq: Four times a day (QID) | INTRAMUSCULAR | Status: DC | PRN

## 2021-06-14 MED ORDER — FENTANYL CITRATE (PF) 100 MCG/2ML IJ SOLN
25.0000 ug | INTRAMUSCULAR | Status: DC | PRN
  Administered 2021-06-14 (×2): 25 ug via INTRAVENOUS

## 2021-06-14 SURGICAL SUPPLY — 65 items
BAG COUNTER SPONGE SURGICOUNT (BAG) ×4 IMPLANT
BAG ZIPLOCK 12X15 (MISCELLANEOUS) ×2 IMPLANT
BASEPLATE AUG FULL 24X20 +2 (Plate) ×2 IMPLANT
BIT DRILL FLUTED 3.0 STRL (BIT) ×2 IMPLANT
BLADE SAG 18X100X1.27 (BLADE) ×2 IMPLANT
CALIBRATOR GLENOID VIP 5-D (SYSTAGENIX WOUND MANAGEMENT) ×2 IMPLANT
CHLORAPREP W/TINT 26 (MISCELLANEOUS) ×2 IMPLANT
COVER BACK TABLE 60X90IN (DRAPES) ×2 IMPLANT
COVER SURGICAL LIGHT HANDLE (MISCELLANEOUS) ×2 IMPLANT
CUP SUT UNIV REVERS 36 NEUTRAL (Cup) ×2 IMPLANT
DRAPE ORTHO SPLIT 77X108 STRL (DRAPES) ×4
DRAPE SHEET LG 3/4 BI-LAMINATE (DRAPES) ×2 IMPLANT
DRAPE SURG 17X11 SM STRL (DRAPES) ×2 IMPLANT
DRAPE SURG ORHT 6 SPLT 77X108 (DRAPES) ×2 IMPLANT
DRAPE TOP 10253 STERILE (DRAPES) ×2 IMPLANT
DRAPE U-SHAPE 47X51 STRL (DRAPES) ×2 IMPLANT
DRESSING AQUACEL AG SP 3.5X10 (GAUZE/BANDAGES/DRESSINGS) ×1 IMPLANT
DRSG AQUACEL AG ADV 3.5X 6 (GAUZE/BANDAGES/DRESSINGS) ×2 IMPLANT
DRSG AQUACEL AG ADV 3.5X10 (GAUZE/BANDAGES/DRESSINGS) ×2 IMPLANT
DRSG AQUACEL AG SP 3.5X10 (GAUZE/BANDAGES/DRESSINGS) ×2
ELECT REM PT RETURN 15FT ADLT (MISCELLANEOUS) ×2 IMPLANT
FACESHIELD WRAPAROUND (MASK) ×2 IMPLANT
GLENOSPHERE 33+4 LAT/24 UNI RV (Joint) ×2 IMPLANT
GLOVE SRG 8 PF TXTR STRL LF DI (GLOVE) ×2 IMPLANT
GLOVE SURG ENC MOIS LTX SZ7.5 (GLOVE) ×8 IMPLANT
GLOVE SURG UNDER POLY LF SZ8 (GLOVE) ×4
GOWN STRL REUS W/ TWL XL LVL3 (GOWN DISPOSABLE) ×2 IMPLANT
GOWN STRL REUS W/TWL XL LVL3 (GOWN DISPOSABLE) ×4
INSERT HUMERAL 36 +3/33 COMBO (Joint) ×2 IMPLANT
KIT BASIN OR (CUSTOM PROCEDURE TRAY) ×2 IMPLANT
KIT TURNOVER KIT A (KITS) ×2 IMPLANT
MANIFOLD NEPTUNE II (INSTRUMENTS) ×2 IMPLANT
NEEDLE TAPERED W/ NITINOL LOOP (MISCELLANEOUS) IMPLANT
NS IRRIG 1000ML POUR BTL (IV SOLUTION) ×2 IMPLANT
PACK SHOULDER (CUSTOM PROCEDURE TRAY) ×2 IMPLANT
PENCIL SMOKE EVACUATOR (MISCELLANEOUS) IMPLANT
PIN NITINOL TARGETER 2.8 (PIN) ×2 IMPLANT
PIN SET MODULAR GLENOID SYSTEM (PIN) ×2 IMPLANT
POST MODULAR 25 (Post) ×2 IMPLANT
POST MODULAR MGS BASEPLATE 25 (Post) ×1 IMPLANT
PROTECTOR NERVE ULNAR (MISCELLANEOUS) ×2 IMPLANT
REAMER ANGLED HEAD SMALL (DRILL) ×2 IMPLANT
RESTRAINT HEAD UNIVERSAL NS (MISCELLANEOUS) ×2 IMPLANT
SCREW PERI LOCK 5.5X16 (Screw) ×4 IMPLANT
SCREW PERI LOCK 5.5X32 (Screw) ×4 IMPLANT
SLING ARM FOAM STRAP MED (SOFTGOODS) ×2 IMPLANT
SLING ARM IMMOBILIZER LRG (SOFTGOODS) ×2 IMPLANT
SMARTMIX MINI TOWER (MISCELLANEOUS)
SPONGE T-LAP 4X18 ~~LOC~~+RFID (SPONGE) ×4 IMPLANT
STEM HUMERAL UNI REVERS SZ6 (Stem) ×2 IMPLANT
STRIP CLOSURE SKIN 1/2X4 (GAUZE/BANDAGES/DRESSINGS) ×2 IMPLANT
SUCTION FRAZIER HANDLE 10FR (MISCELLANEOUS) ×2
SUCTION TUBE FRAZIER 10FR DISP (MISCELLANEOUS) ×1 IMPLANT
SUT FIBERWIRE #2 38 T-5 BLUE (SUTURE)
SUT MON AB 3-0 SH 27 (SUTURE) ×2
SUT MON AB 3-0 SH27 (SUTURE) ×1 IMPLANT
SUT VIC AB 0 CT1 36 (SUTURE) ×2 IMPLANT
SUT VIC AB 1 CT1 36 (SUTURE) ×2 IMPLANT
SUT VIC AB 2-0 CT1 27 (SUTURE) ×2
SUT VIC AB 2-0 CT1 TAPERPNT 27 (SUTURE) ×1 IMPLANT
SUTURE FIBERWR #2 38 T-5 BLUE (SUTURE) IMPLANT
SUTURE TAPE 1.3 40 TPR END (SUTURE) ×3 IMPLANT
SUTURETAPE 1.3 40 TPR END (SUTURE) ×6
TOWEL OR 17X26 10 PK STRL BLUE (TOWEL DISPOSABLE) ×2 IMPLANT
TOWER SMARTMIX MINI (MISCELLANEOUS) IMPLANT

## 2021-06-14 NOTE — H&P (Signed)
ORTHOPAEDIC H&P  REQUESTING PHYSICIAN: Nicholes Stairs, MD  PCP:  Aura Dials, MD  Chief Complaint: Right shoulder pain  HPI: Kristin Richmond is a 75 y.o. female who complains of significant right shoulder pain and dysfunction.  She has rotator cuff arthropathy and is here today for reverse arthroplasty for definitive treatment.  No new complaints today.  Past Medical History:  Diagnosis Date   Anemia    Arthritis    Cancer ([place])    Past Surgical History:  Procedure Laterality Date   BUNIONECTOMY Left    CATARACT EXTRACTION W/ INTRAOCULAR LENS IMPLANT     EYE SURGERY     TUBAL LIGATION     Social History   Socioeconomic History   Marital status: Single    Spouse name: Not on file   Number of children: Not on file   Years of education: Not on file   Highest education level: Not on file  Occupational History   Not on file  Tobacco Use   Smoking status: Never    Passive exposure: Past   Smokeless tobacco: Never  Vaping Use   Vaping Use: Never used  Substance and Sexual Activity   Alcohol use: Never   Drug use: Never   Sexual activity: Not on file  Other Topics Concern   Not on file  Social History Narrative   Not on file   Social Determinants of Health   Financial Resource Strain: Not on file  Food Insecurity: Not on file  Transportation Needs: Not on file  Physical Activity: Not on file  Stress: Not on file  Social Connections: Not on file   No family history on file. No Known Allergies Prior to Admission medications   Medication Sig Start Date End Date Taking? Authorizing Provider  acetaminophen (TYLENOL) 325 MG tablet Take 325 mg by mouth every 6 (six) hours as needed (for pain).   Yes [provider]  augmented betamethasone dipropionate (DIPROLENE-AF) 0.05 % cream Apply 1 application topically 2 (two) times daily as needed (skin irritation (rash)). 05/22/21  Yes [provider]  Calcium Carb-Cholecalciferol (CALCIUM  + D3 PO) Take 1 tablet by mouth in the morning.   Yes [provider]  ezetimibe (ZETIA) 10 MG tablet Take 10 mg by mouth in the morning. 12/11/20  Yes [provider]  Menthol-Methyl Salicylate (SALONPAS PAIN RELIEF PATCH) [place] 1 patch onto the skin daily as needed (pain).   Yes [provider]  Multiple Vitamin (MULTIVITAMIN WITH MINERALS) TABS tablet Take 1 tablet by mouth in the morning.   Yes [provider]  naproxen (NAPROSYN) 500 MG tablet Take 500 mg by mouth 2 (two) times daily. 04/20/21  Yes [provider]   No results found.  Positive ROS: All other systems have been reviewed and were otherwise negative with the exception of those mentioned in the HPI and as above.  Physical Exam: General: Alert, no acute distress Cardiovascular: No pedal edema Respiratory: No cyanosis, no use of accessory musculature GI: No organomegaly, abdomen is soft and non-tender Skin: No lesions in the area of chief complaint Neurologic: Sensation intact distally Psychiatric: Patient is competent for consent with normal mood and affect Lymphatic: No axillary or cervical lymphadenopathy  MUSCULOSKELETAL:  Right upper extremity is warm and well-perfused and neurovascularly intact.  Assessment: Right shoulder rotator cuff arthropathy  Plan: -Plan to proceed today with reverse arthroplasty for definitive treatment for the right shoulder.  We again reviewed the risk and benefits.  These include but are not limited to bleeding, infection, damage to surrounding nerves and vessels, stiffness, dislocation, fracture, need for revision surgery, and the risk of anesthesia.  She has provided informed consent.  -Plan to admit postoperatively for observation.  She can discharge home tomorrow.    Nicholes Stairs, MD Cell [phone]    06/14/2021 12:42 PM

## 2021-06-14 NOTE — Transfer of Care (Signed)
Immediate Anesthesia Transfer of Care Note  Patient: Kristin Richmond  Procedure(s) Performed: REVERSE SHOULDER ARTHROPLASTY (Right: Shoulder)  Patient Location: PACU  Anesthesia Type:GA combined with regional for post-op pain  Level of Consciousness: awake, alert , oriented, drowsy and patient cooperative  Airway & Oxygen Therapy: Patient Spontanous Breathing and Patient connected to face mask oxygen  Post-op Assessment: Report given to RN and Post -op Vital signs reviewed and stable  Post vital signs: Reviewed and stable  Last Vitals:  Vitals Value Taken Time  BP 153/73 06/14/21 1511  Temp    Pulse 73 06/14/21 1514  Resp 17 06/14/21 1514  SpO2 100 % 06/14/21 1514  Vitals shown include unvalidated device data.  Last Pain:  Vitals:   06/14/21 1233  TempSrc:   PainSc: 0-No pain         Complications: No notable events documented.

## 2021-06-14 NOTE — Anesthesia Preprocedure Evaluation (Addendum)
Anesthesia Evaluation  Patient identified by MRN, date of birth, ID band Patient awake    Reviewed: Allergy & Precautions, NPO status , Patient's Chart, lab work & pertinent test results  History of Anesthesia Complications Negative for: history of anesthetic complications  Airway Mallampati: II  TM Distance: >3 FB Neck ROM: Full    Dental no notable dental hx.    Pulmonary neg pulmonary ROS,    Pulmonary exam normal        Cardiovascular negative cardio ROS Normal cardiovascular exam     Neuro/Psych negative neurological ROS  negative psych ROS   GI/Hepatic negative GI ROS, Neg liver ROS,   Endo/Other  negative endocrine ROS  Renal/GU negative Renal ROS  negative genitourinary   Musculoskeletal  (+) Arthritis ,   Abdominal   Peds  Hematology negative hematology ROS (+)   Anesthesia Other Findings Day of surgery medications reviewed with patient.  Reproductive/Obstetrics negative OB ROS                            Anesthesia Physical Anesthesia Plan  ASA: 2  Anesthesia Plan: General   Post-op Pain Management: GA combined w/ Regional for post-op pain   Induction:   PONV Risk Score and Plan: 3 and Treatment may vary due to age or medical condition, Dexamethasone and Ondansetron  Airway Management Planned: Oral ETT  Additional Equipment: None  Intra-op Plan:   Post-operative Plan: Extubation in OR  Informed Consent: I have reviewed the patients History and Physical, chart, labs and discussed the procedure including the risks, benefits and alternatives for the proposed anesthesia with the patient or authorized representative who has indicated his/her understanding and acceptance.     Dental advisory given  Plan Discussed with: CRNA  Anesthesia Plan Comments:        Anesthesia Quick Evaluation

## 2021-06-14 NOTE — Plan of Care (Signed)
  Problem: Pain Managment: Goal: General experience of comfort will improve Outcome: Progressing   Problem: Safety: Goal: Ability to remain free from injury will improve Outcome: Progressing   

## 2021-06-14 NOTE — Brief Op Note (Signed)
06/14/2021  3:08 PM  PATIENT:  Kristin Richmond  75 y.o. female  PRE-OPERATIVE DIAGNOSIS:  right shoulder osteoarthritis  POST-OPERATIVE DIAGNOSIS:  right shoulder osteoarthritis  PROCEDURE:  Procedure(s) with comments: REVERSE SHOULDER ARTHROPLASTY (Right) - 159mn  SURGEON:  Surgeon(s) and Role:    * RStann Mainland JElly Modena MD - Primary  PHYSICIAN ASSISTANT: KJonelle Sidle PA-C  ANESTHESIA:   regional and general  EBL:  120 mL   BLOOD ADMINISTERED:none  DRAINS: none   LOCAL MEDICATIONS USED:  NONE  SPECIMEN:  No Specimen  DISPOSITION OF SPECIMEN:  N/A  COUNTS:  YES  TOURNIQUET:  * No tourniquets in log *  DICTATION: .Note written in EPIC  PLAN OF CARE: Admit for overnight observation  PATIENT DISPOSITION:  PACU - hemodynamically stable.   Delay start of Pharmacological VTE agent (>24hrs) due to surgical blood loss or risk of bleeding: not applicable

## 2021-06-14 NOTE — Anesthesia Procedure Notes (Signed)
Procedure Name: Intubation Date/Time: 06/14/2021 1:04 PM Performed by: Raenette Rover, CRNA Pre-anesthesia Checklist: Patient identified, Emergency Drugs available, Suction available and Patient being monitored Patient Re-evaluated:Patient Re-evaluated prior to induction Oxygen Delivery Method: Circle system utilized Preoxygenation: Pre-oxygenation with 100% oxygen Induction Type: IV induction Ventilation: Mask ventilation without difficulty Laryngoscope Size: Mac and 3 Grade View: Grade I Tube type: Oral Tube size: 7.0 mm Number of attempts: 1 Airway Equipment and Method: Stylet Placement Confirmation: ETT inserted through vocal cords under direct vision, positive ETCO2 and breath sounds checked- equal and bilateral Secured at: 20 cm Tube secured with: Tape Dental Injury: Teeth and Oropharynx as per pre-operative assessment

## 2021-06-14 NOTE — Progress Notes (Signed)
Assisted Dr. Howse with right, ultrasound guided, interscalene  block. Side rails up, monitors on throughout procedure. See vital signs in flow sheet. Tolerated Procedure well.  

## 2021-06-14 NOTE — Plan of Care (Signed)
  Problem: Education: Goal: Knowledge of General Education information will improve Description: Including pain rating scale, medication(s)/side effects and non-pharmacologic comfort measures Outcome: Progressing   Problem: Activity: Goal: Risk for activity intolerance will decrease Outcome: Progressing   Problem: Nutrition: Goal: Adequate nutrition will be maintained Outcome: Progressing   Problem: Coping: Goal: Level of anxiety will decrease Outcome: Progressing   

## 2021-06-14 NOTE — Anesthesia Procedure Notes (Signed)
Anesthesia Regional Block: Interscalene brachial plexus block   Pre-Anesthetic Checklist: , timeout performed,  Correct Patient, Correct Site, Correct Laterality,  Correct Procedure, Correct Position, site marked,  Risks and benefits discussed,  Pre-op evaluation,  At surgeon's request and post-op pain management  Laterality: Right  Prep: Maximum Sterile Barrier Precautions used, chloraprep       Needles:  Injection technique: Single-shot  Needle Type: Echogenic Stimulator Needle     Needle Length: 9cm  Needle Gauge: 22     Additional Needles:   Procedures:,,,, ultrasound used (permanent image in chart),,    Narrative:  Start time: 06/14/2021 12:27 PM End time: 06/14/2021 12:31 PM Injection made incrementally with aspirations every 5 mL.  Performed by: Personally  Anesthesiologist: Brennan Bailey, MD  Additional Notes: Risks, benefits, and alternative discussed. Patient gave consent for procedure. Patient prepped and draped in sterile fashion. Sedation administered, patient remains easily responsive to voice. Relevant anatomy identified with ultrasound guidance. Local anesthetic given in 5cc increments with no signs or symptoms of intravascular injection. No pain or paraesthesias with injection. Patient monitored throughout procedure with signs of LAST or immediate complications. Tolerated well. Ultrasound image placed in chart.  Tawny Asal, MD

## 2021-06-14 NOTE — Discharge Instructions (Signed)
Orthopedic surgery discharge instructions:  -Maintain postoperative bandage until follow-up appointment.  This is waterproof, and you may begin showering on postoperative day #3.  Do not submerge underwater.  Maintain that bandage until your follow-up appointment in 2 weeks.  -No lifting over 2 pounds with operateive arm.  You may use the arm immediately for activities of daily living such as bathing, washing your face and brushing your teeth, eating, and getting dressed.  Otherwise maintain your sling when you are out of the house and sleeping.  -Apply ice liberally to the shoulder throughout the day.  For mild to moderate pain use Tylenol and Advil as needed around-the-clock.  For breakthrough pain use oxycodone as necessary.  -You will return to see Dr. Shanequa Whitenight in the office in 2 weeks for routine postoperative check with x-rays.  

## 2021-06-14 NOTE — Op Note (Addendum)
06/14/2021  3:09 PM  PATIENT:  Kristin Richmond    PRE-OPERATIVE DIAGNOSIS:  right shoulder osteoarthritis  POST-OPERATIVE DIAGNOSIS:  Same  PROCEDURE:  Right REVERSE SHOULDER ARTHROPLASTY  SURGEON:  Nicholes Stairs, MD  ASSISTANT: Jonelle Sidle, PA-C  Assistant attestation:  PA Thereasa Solo was present for the entire procedure.  He participated in all critical portions.  ANESTHESIA:   General  ESTIMATED BLOOD LOSS: 120 cc  PREOPERATIVE INDICATIONS:  Kristin Richmond is a  75 y.o. female with a diagnosis of right shoulder osteoarthritis who failed conservative measures and elected for surgical management.    The risks benefits and alternatives were discussed with the patient preoperatively including but not limited to the risks of infection, bleeding, nerve injury, cardiopulmonary complications, the need for revision surgery, dislocation, brachial plexus palsy, incomplete relief of pain, among others, and the patient was willing to proceed.  OPERATIVE IMPLANTS:  Arthrex size 6 humeral stem 24 mm + 2 mm Full augment at 11 oclock with 25 mm central post  4 peripheral locking screws on baseplate 33+4 mm lateralized glenosphere Standard humeral tray with +3 polyethylene liner  OPERATIVE FINDINGS:  Significant osteoarthritis with advanced humeral head collapse and sclerosis with denuded cartilage on the glenoid and humerus.  She had a retroverted glenoid with superior migration as well.  Degenerative tearing and biceps groove stenosis of the biceps long head.  There was a large ossific fragment noted on the capsular aspect of the anterior joint along the subscapularis.  There was thinning of the subscapularis.  Supraspinatus was torn of the anterior half.  Teres minor and infraspinatus were intact.  OPERATIVE PROCEDURE: The patient was brought to the operating room and placed in the supine position. General anesthesia was administered. IV antibiotics were given. A Foley was  not  placed. Time out was performed. The upper extremity was prepped and draped in usual sterile fashion. The patient was in a beachchair position. Deltopectoral approach was carried out. The biceps was tenotomized. The subscapularis was released off of the bone.  There was a full-thickness tear noted in the midportion of the subscapularis.  I then performed circumferential releases of the humerus, and then dislocated the head, and then reamed with the reamer to the above named size.  I then applied the jig, and cut the humeral head in 30 of retroversion, and then turned my attention to the glenoid.  Deep retractors were placed, and I resected the labrum, and then placed a guidepin into the center position on the glenoid, with slight inferior inclination. I then reamed over the guidepin, and this created a small metaphyseal cancellus blush inferiorly, removing just the cartilage to the subchondral bone superiorly.  We prepared for a full augment glenoid baseplate with the apex of the augment oriented at the 11 o'clock position.  We drilled over the guidepin for a 25 mm central post.  The base plate was selected and impacted place, and then I placed 4 peripheral locking screws.  32 mm north-south and 60 mm [place] and [place].  I then turned my attention to the glenosphere, and impacted this into place, and securing with the setscrew.  The glenoid sphere was completely seated, and had engagement of the [facility] taper. I then turned my attention back to the humerus.  I sequentially broached, and then trialed, and was found to restore soft tissue tension, and it had 2 finger tightness. Therefore the above named components were selected. The shoulder felt stable throughout functional motion.  Before  I placed the real prosthesis I had also placed a total of 3 #2 FiberWire through drill holes in the humerus for later subscapularis repair.  I then impacted the real prosthesis into place, as well as the real humeral  tray, and reduced the shoulder. The shoulder had excellent motion, and was stable, and I irrigated the wounds copiously.   I then irrigated the shoulder copiously once more, repaired the deltopectoral interval, infiltrated the deltopectoral interval with 1 g of vancomycin powder.  Closed that deltopectoral interval with # 1 Vicryl followed by subcutaneous Vicryl, then monocryl for the skin,  with Steri-Strips and sterile gauze for the skin. The patient was awakened and returned back in stable and satisfactory condition. There no complications and they tolerated the procedure well.  All counts were correct x2.  Disposition:  Kristin Richmond will be okay for activities of daily living starting immediately with the right arm.  No lifting over 2 pounds.  She will be admitted for overnight observation and routine postoperative labs to be evaluated in the morning.  She will discharge home following x-rays in PACU.  I will see her back in the office in 2 weeks with 2 x-ray views of the right shoulder.

## 2021-06-15 DIAGNOSIS — Z859 Personal history of malignant neoplasm, unspecified: Secondary | ICD-10-CM | POA: Diagnosis not present

## 2021-06-15 DIAGNOSIS — M19011 Primary osteoarthritis, right shoulder: Secondary | ICD-10-CM | POA: Diagnosis not present

## 2021-06-15 LAB — BASIC METABOLIC PANEL
Anion gap: 11 (ref 5–15)
BUN: 11 mg/dL (ref 8–23)
CO2: 23 mmol/L (ref 22–32)
Calcium: 8.3 mg/dL — ABNORMAL LOW (ref 8.9–10.3)
Chloride: 103 mmol/L (ref 98–111)
Creatinine, Ser: 0.68 mg/dL (ref 0.44–1.00)
GFR, Estimated: >60 mL/min (ref >60)
Glucose, Bld: 194 mg/dL — ABNORMAL HIGH (ref 70–99)
Potassium: 3.7 mmol/L (ref 3.5–5.1)
Sodium: 137 mmol/L (ref 135–145)

## 2021-06-15 LAB — HEMOGLOBIN AND HEMATOCRIT, BLOOD
HCT: 37.4 % (ref 36.0–46.0)
Hemoglobin: 11.9 g/dL — ABNORMAL LOW (ref 12.0–15.0)

## 2021-06-15 NOTE — Progress Notes (Signed)
    Subjective:  Patient reports pain as mild to moderate.  Denies N/V/CP/SOB. Patient working with therapy  Objective:   VITALS:   Vitals:   06/14/21 1713 06/14/21 2158 06/15/21 0129 06/15/21 0526  BP:  127/70 127/64 117/66  Pulse:  91 88 78  Resp:  '16 16 18  '$ Temp:  97.8 F (36.6 C) 98 F (36.7 C) 97.8 F (36.6 C)  TempSrc:  Oral Oral Oral  SpO2:  94% 94% 97%  Weight: 75.8 kg     Height: 5' 7.25" (1.708 m)       NAD Neurovascular intact Sensation intact distally Intact pulses distally Incision: dressing C/D/I   Lab Results  Component Value Date   WBC 7.1 06/04/2021   HGB 11.9 (L) 06/15/2021   HCT 37.4 06/15/2021   MCV 89.1 06/04/2021   PLT 394 06/04/2021   BMET    Component Value Date/Time   NA 137 06/15/2021 0330   K 3.7 06/15/2021 0330   CL 103 06/15/2021 0330   CO2 23 06/15/2021 0330   GLUCOSE 194 (H) 06/15/2021 0330   BUN 11 06/15/2021 0330   CREATININE 0.68 06/15/2021 0330   CALCIUM 8.3 (L) 06/15/2021 0330   GFRNONAA >60 06/15/2021 0330     Assessment/Plan: 1 Day Post-Op   Active Problems:   S/P reverse total shoulder arthroplasty, right   WBAT with walker DVT ppx: Aspirin, SCDs PO pain control PT/OT Dispo: D/C once cleared by therapy     Dorothyann Peng 06/15/2021, 8:23 AM [facility] is now [address], [place], [place] [address] Phone: [phone] [email or web address] Facebook  [employer]

## 2021-06-15 NOTE — Plan of Care (Signed)
Pt stable at this time. Pt to d/c home. No needs at this time.

## 2021-06-15 NOTE — Evaluation (Signed)
Occupational Therapy Evaluation Patient Details Name: Kristin Richmond MRN: [id number] DOB: 15-Sep-1946 Today's Date: 06/15/2021    History of Present Illness Patient s/p right rTSA   Clinical Impression   Mrs. Kristin Richmond is a 75 year old woman s/p shoulder replacement without functional use of right dominant upper extremity secondary to effects of surgery and interscalene block and shoulder precautions. Therapist provided education and instruction to patient in regards to exercises, precautions, positioning, donning upper extremity clothing and bathing while maintaining shoulder precautions, ice and edema management and donning/doffing sling. Patient verbalized understanding and demonstrated as needed. Patient exhibited ability to perform ADLs using compensatory strategies. Handouts provided to maximize retention of education/instructions. Patient to follow up with MD for further therapy needs.      Follow Up Recommendations  No OT follow up    Equipment Recommendations  None recommended by OT    Recommendations for Other Services       Precautions / Restrictions Precautions Precautions: Shoulder Type of Shoulder Precautions: Sling At all times except ADL / exercise  Non weight bearing Yes  AROM elbow, wrist and hand to tolerance Yes  AROM / PROM Forward Flexion 0-90  AROM / PROM Abduction 0-60  AROM / PROM External Rotation 0-30 Shoulder Interventions: Shoulder sling/immobilizer;At all times;Off for dressing/bathing/exercises Precaution Booklet Issued:  (handouts provided) Restrictions Weight Bearing Restrictions: Yes RUE Weight Bearing: Non weight bearing      Mobility Bed Mobility Overal bed mobility: Modified Independent                  Transfers Overall transfer level: Needs assistance               General transfer comment: Supervision. Typically uses a cane at baseline.    Balance Overall balance assessment: Mild deficits observed, not formally  tested                                         ADL either performed or assessed with clinical judgement   ADL Overall ADL's : Needs assistance/impaired Eating/Feeding: Independent   Grooming: Independent   Upper Body Bathing: Modified independent   Lower Body Bathing: Modified independent   Upper Body Dressing : Modified independent   Lower Body Dressing: Modified independent   Toilet Transfer: Modified Independent   Toileting- Clothing Manipulation and Hygiene: Modified independent       Functional mobility during ADLs: Supervision/safety       Vision Patient Visual Report: No change from baseline       Perception     Praxis      Pertinent Vitals/Pain Pain Assessment: No/denies pain     Hand Dominance     Extremity/Trunk Assessment Upper Extremity Assessment Upper Extremity Assessment: RUE deficits/detail;LUE deficits/detail RUE Deficits / Details: impaired by effects of interscalene block and limited by shoulder precautions RUE: Unable to fully assess due to immobilization LUE Deficits / Details: WFL LUE Sensation: WNL LUE Coordination: WNL   Lower Extremity Assessment Lower Extremity Assessment: Overall WFL for tasks assessed   Cervical / Trunk Assessment Cervical / Trunk Assessment: Normal   Communication     Cognition Arousal/Alertness: Awake/alert Behavior During Therapy: WFL for tasks assessed/performed Overall Cognitive Status: Within Functional Limits for tasks assessed  General Comments       Exercises     Shoulder Instructions      Home Living                                          Prior Functioning/Environment                   OT Problem List: Decreased strength;Decreased range of motion;Impaired UE functional use;Impaired tone;Decreased knowledge of precautions      OT Treatment/Interventions:      OT Goals(Current goals can be  found in the care plan section) Acute Rehab OT Goals OT Goal Formulation: All assessment and education complete, DC therapy  OT Frequency:     Barriers to D/C:            Co-evaluation              AM-PAC OT "6 Clicks" Daily Activity     Outcome Measure Help from another person eating meals?: None Help from another person taking care of personal grooming?: None Help from another person toileting, which includes using toliet, bedpan, or urinal?: None Help from another person bathing (including washing, rinsing, drying)?: None Help from another person to put on and taking off regular upper body clothing?: None Help from another person to put on and taking off regular lower body clothing?: None 6 Click Score: 24   End of Session Nurse Communication: Mobility status (OT education complete, okay to discharge)  Activity Tolerance: Patient tolerated treatment well Patient left: in chair;with call bell/phone within reach  OT Visit Diagnosis: Pain                Time: [id number] OT Time Calculation (min): 23 min Charges:  OT General Charges $OT Visit: 1 Visit OT Evaluation $OT Eval Low Complexity: 1 Low OT Treatments $Self Care/Home Management : 8-22 mins  Derl Barrow, OTR/L [place]  Office [phone] Pager: [phone]   Lenward Chancellor 06/15/2021, 9:42 AM

## 2021-06-15 NOTE — Progress Notes (Addendum)
Pt stable at time of d/c. No needs at time of d/c. Pt dressing clean, dry, and intact.  

## 2021-06-16 NOTE — Anesthesia Postprocedure Evaluation (Signed)
Anesthesia Post Note  Patient: Kristin Richmond  Procedure(s) Performed: REVERSE SHOULDER ARTHROPLASTY (Right: Shoulder)     Patient location during evaluation: PACU Anesthesia Type: General Level of consciousness: awake and alert and oriented Pain management: pain level controlled Vital Signs Assessment: post-procedure vital signs reviewed and stable Respiratory status: spontaneous breathing, nonlabored ventilation and respiratory function stable Cardiovascular status: blood pressure returned to baseline Postop Assessment: no apparent nausea or vomiting Anesthetic complications: no   No notable events documented.        Brennan Bailey

## 2021-06-17 ENCOUNTER — Encounter (HOSPITAL_COMMUNITY): Payer: Self-pay | Admitting: Orthopedic Surgery

## 2021-06-17 NOTE — Discharge Summary (Signed)
Patient ID: Kristin Richmond MRN: [id number] DOB/AGE: [birth year] 74 y.o.  Admit date: 06/14/2021 Discharge date: 06/15/2021  Primary Diagnosis: Right shoulder osteoarthritis Admission Diagnoses: status post reverse total shoulder arthroplasty right Past Medical History:  Diagnosis Date   Anemia    Arthritis    Cancer [facility])    Discharge Diagnoses:   Active Problems:   S/P reverse total shoulder arthroplasty, right  Estimated body mass index is 25.98 kg/m as calculated from the following:   Height as of this encounter: 5' 7.25" (1.708 m).   Weight as of this encounter: 75.8 kg.  Procedure:  Procedure(s) (LRB): REVERSE SHOULDER ARTHROPLASTY (Right)   Consults: None  HPI: Kristin Richmond is a 75 year old female presenting to hospital for an elective right reverse total shoulder arthroplasty.  Patient was seen in our clinic and determined to require a right reverse total shoulder arthroplasty to improve her quality of life and symptoms of her right shoulder pain.  Risks and benefits of the procedure were discussed with the patient in clinic and patient elected proceed.  Patient underwent reverse total shoulder arthroplasty of the right shoulder on 06/14/2021.  She was admitted for postoperative pain control and monitoring. Laboratory Data: Admission on 06/14/2021, Discharged on 06/15/2021  Component Date Value Ref Range Status   Hemoglobin 06/15/2021 11.9 (A) 12.0 - 15.0 g/dL Final   HCT 06/15/2021 37.4  36.0 - 46.0 % Final   Performed at [facility], [place] [address], [place], [place] [address]   Sodium 06/15/2021 137  135 - 145 mmol/L Final   Potassium 06/15/2021 3.7  3.5 - 5.1 mmol/L Final   Chloride 06/15/2021 103  98 - 111 mmol/L Final   CO2 06/15/2021 23  22 - 32 mmol/L Final   Glucose, Bld 06/15/2021 194 (A) 70 - 99 mg/dL Final   Glucose reference range applies only to samples taken after fasting for at least 8 hours.   BUN 06/15/2021 11  8 - 23 mg/dL Final    Creatinine, Ser 06/15/2021 0.68  0.44 - 1.00 mg/dL Final   Calcium 06/15/2021 8.3 (A) 8.9 - 10.3 mg/dL Final   GFR, Estimated 06/15/2021 >60  >60 mL/min Final   Comment: (NOTE) Calculated using the CKD-EPI Creatinine Equation (2021)    Anion gap 06/15/2021 11  5 - 15 Final   Performed at [facility], [place] [address] [place], [place] [address]  Hospital Outpatient Visit on 06/11/2021  Component Date Value Ref Range Status   SARS Coronavirus 2 06/11/2021 NEGATIVE  NEGATIVE Final   Comment: (NOTE) SARS-CoV-2 target nucleic acids are NOT DETECTED.  The SARS-CoV-2 RNA is generally detectable in upper and lower respiratory specimens during the acute phase of infection. Negative results do not preclude SARS-CoV-2 infection, do not rule out co-infections with other pathogens, and should not be used as the sole basis for treatment or other patient management decisions. Negative results must be combined with clinical observations, patient history, and epidemiological information. The expected result is Negative.  Fact Sheet for Patients: SugarRoll.be  Fact Sheet for Healthcare Providers: https://www.woods-mathews.com/  This test is not yet approved or cleared by the [place] FDA and  has been authorized for detection and/or diagnosis of SARS-CoV-2 by FDA under an Emergency Use Authorization (EUA). This EUA will remain  in effect (meaning this test can be used) for the duration of the COVID-19 declaration under Se  ction 564(b)(1) of the Act, 21 U.S.C. section 360bbb-3(b)(1), unless the authorization is terminated or revoked sooner.  Performed at [facility], [place] [address] [place], [place] [address]   Hospital Outpatient Visit on 06/04/2021  Component Date Value Ref Range Status   MRSA, PCR 06/04/2021 NEGATIVE  NEGATIVE Final   Staphylococcus aureus 06/04/2021 NEGATIVE  NEGATIVE Final    Comment: (NOTE) The Xpert SA Assay (FDA approved for NASAL specimens in patients 79 years of age and older), is one component of a comprehensive surveillance program. It is not intended to diagnose infection nor to guide or monitor treatment. Performed at [facility], [place] [address], [place], [place] [address]    WBC 06/04/2021 7.1  4.0 - 10.5 K/uL Final   RBC 06/04/2021 5.14 (A) 3.87 - 5.11 MIL/uL Final   Hemoglobin 06/04/2021 14.8  12.0 - 15.0 g/dL Final   HCT 06/04/2021 45.8  36.0 - 46.0 % Final   MCV 06/04/2021 89.1  80.0 - 100.0 fL Final   MCH 06/04/2021 28.8  26.0 - 34.0 pg Final   MCHC 06/04/2021 32.3  30.0 - 36.0 g/dL Final   RDW 06/04/2021 14.5  11.5 - 15.5 % Final   Platelets 06/04/2021 394  150 - 400 K/uL Final   nRBC 06/04/2021 0.0  0.0 - 0.2 % Final   Performed at [facility], [place] [address] [place], [place] [address]     X-Rays:DG Shoulder Right Port  Result Date: 06/14/2021 CLINICAL DATA:  Reverse total shoulder arthroplasty EXAM: PORTABLE RIGHT SHOULDER COMPARISON:  None. FINDINGS: Reverse right shoulder arthroplasty in expected alignment. No periprosthetic lucency or fracture. Recent postsurgical change includes air and edema in soft tissues and joint space. IMPRESSION: Reverse right shoulder arthroplasty in expected alignment. No immediate postoperative complication. Electronically Signed   By: Keith Rake M.D.   On: 06/14/2021 16:40    EKG:No orders found for this or any previous visit.   Hospital Course: Kristin Richmond is a 75 y.o. who was admitted to Hospital. They were brought to the operating room on 06/14/2021 and underwent Procedure(s): [place].  Patient tolerated the procedure well and was later transferred to the recovery room and then to the orthopaedic floor for postoperative care.  They were given PO and IV analgesics for pain control following their surgery.  They were given 24 hours of  postoperative antibiotics of  Anti-infectives (From admission, onward)    Start     Dose/Rate Route Frequency Ordered Stop   06/14/21 1930  ceFAZolin (ANCEF) IVPB 1 g/50 mL premix  Status:  Discontinued        1 g 100 mL/hr over 30 Minutes Intravenous Every 6 hours 06/14/21 1612 06/15/21 1329   06/14/21 1344  vancomycin (VANCOCIN) powder  Status:  Discontinued          As needed 06/14/21 1344 06/14/21 1612   06/14/21 1030  ceFAZolin (ANCEF) 2 g in sodium chloride 0.9 % 100 mL IVPB        2 g 200 mL/hr over 30 Minutes Intravenous On call to O.R. 06/14/21 1023 06/14/21 1340      and started on DVT prophylaxis in the form of Aspirin.   PT and OT were ordered for total joint protocol.  Discharge planning consulted to help with postop disposition and equipment needs.  Patient had a uneventg night on the evening of surgery.  They started to get up OOB with therapy on day one. the patient  had progressed with therapy and meeting their goals.Dressing was C/D/Il.  Patient was seen in rounds and was ready to go home.  Diet: Regular diet Activity:NWB Follow-up:in 2 weeks Disposition - Home Discharged Condition: good   Discharge Instructions     Call MD / Call 911   Complete by: As directed    If you experience chest pain or shortness of breath, CALL 911 and be transported to the hospital emergency room.  If you develope a fever above 101 F, pus (white drainage) or increased drainage or redness at the wound, or calf pain, call your surgeon's office.   Constipation Prevention   Complete by: As directed    Drink plenty of fluids.  Prune juice may be helpful.  You may use a stool softener, such as Colace (over the counter) 100 mg twice a day.  Use MiraLax (over the counter) for constipation as needed.   Diet - low sodium heart healthy   Complete by: As directed    Discharge instructions   Complete by: As directed    Follow discharge instructions as provided by Dr.Rogers   Increase activity slowly  as tolerated   Complete by: As directed    Post-operative opioid taper instructions:   Complete by: As directed    POST-OPERATIVE OPIOID TAPER INSTRUCTIONS: It is important to wean off of your opioid medication as soon as possible. If you do not need pain medication after your surgery it is ok to stop day one. Opioids include: Codeine, Hydrocodone(Norco, Vicodin), Oxycodone(Percocet, oxycontin) and hydromorphone amongst others.  Long term and even short term use of opiods can cause: Increased pain response Dependence Constipation Depression Respiratory depression And more.  Withdrawal symptoms can include Flu like symptoms Nausea, vomiting And more Techniques to manage these symptoms Hydrate well Eat regular healthy meals Stay active Use relaxation techniques(deep breathing, meditating, yoga) Do Not substitute Alcohol to help with tapering If you have been on opioids for less than two weeks and do not have pain than it is ok to stop all together.  Plan to wean off of opioids This plan should start within one week post op of your joint replacement. Maintain the same interval or time between taking each dose and first decrease the dose.  Cut the total daily intake of opioids by one tablet each day Next start to increase the time between doses. The last dose that should be eliminated is the evening dose.         Allergies as of 06/15/2021   No Known Allergies      Medication List     TAKE these medications    acetaminophen 325 MG tablet Commonly known as: TYLENOL Take 325 mg by mouth every 6 (six) hours as needed (for pain).   augmented betamethasone dipropionate 0.05 % cream Commonly known as: DIPROLENE-AF Apply 1 application topically 2 (two) times daily as needed (skin irritation (rash)).   CALCIUM + D3 PO Take 1 tablet by mouth in the morning.   ezetimibe 10 MG tablet Commonly known as: ZETIA Take 10 mg by mouth in the morning.   HYDROcodone-acetaminophen  5-325 MG tablet Commonly known as: NORCO/VICODIN Take 2 tablets by mouth every 6 (six) hours as needed for moderate pain.   methocarbamol 500 MG tablet Commonly known as: Robaxin Take 1 tablet (500 mg total) by mouth every 6 (six) hours as needed for muscle spasms.   multivitamin with minerals Tabs tablet Take 1 tablet by mouth in the morning.   naproxen  500 MG tablet Commonly known as: NAPROSYN Take 500 mg by mouth 2 (two) times daily.   ondansetron 4 MG tablet Commonly known as: Zofran Take 1 tablet (4 mg total) by mouth daily as needed for nausea or vomiting.   Salonpas Pain Relief Patch Ptch Place 1 patch onto the skin daily as needed (pain).        Follow-up Information     Nicholes Stairs, MD Follow up in 2 week(s).   Specialty: Orthopedic Surgery Why: For wound re-check Contact information: [address] [place] [address] [id number]                 Signed: Jonelle Sidle PA-C Orthopaedic Surgery 06/17/2021, 2:18 PM

## 2021-07-01 DIAGNOSIS — M19011 Primary osteoarthritis, right shoulder: Secondary | ICD-10-CM | POA: Diagnosis not present

## 2021-07-01 DIAGNOSIS — Z4789 Encounter for other orthopedic aftercare: Secondary | ICD-10-CM | POA: Diagnosis not present

## 2021-07-30 DIAGNOSIS — Z4789 Encounter for other orthopedic aftercare: Secondary | ICD-10-CM | POA: Diagnosis not present

## 2021-08-09 DIAGNOSIS — Z4789 Encounter for other orthopedic aftercare: Secondary | ICD-10-CM | POA: Diagnosis not present

## 2021-08-09 DIAGNOSIS — Z96611 Presence of right artificial shoulder joint: Secondary | ICD-10-CM | POA: Diagnosis not present

## 2021-08-09 DIAGNOSIS — M25611 Stiffness of right shoulder, not elsewhere classified: Secondary | ICD-10-CM | POA: Diagnosis not present

## 2021-08-09 DIAGNOSIS — M25511 Pain in right shoulder: Secondary | ICD-10-CM | POA: Diagnosis not present

## 2021-08-09 DIAGNOSIS — M6281 Muscle weakness (generalized): Secondary | ICD-10-CM | POA: Diagnosis not present

## 2021-08-13 DIAGNOSIS — Z96611 Presence of right artificial shoulder joint: Secondary | ICD-10-CM | POA: Diagnosis not present

## 2021-08-13 DIAGNOSIS — Z4789 Encounter for other orthopedic aftercare: Secondary | ICD-10-CM | POA: Diagnosis not present

## 2021-08-13 DIAGNOSIS — M25611 Stiffness of right shoulder, not elsewhere classified: Secondary | ICD-10-CM | POA: Diagnosis not present

## 2021-08-13 DIAGNOSIS — M25511 Pain in right shoulder: Secondary | ICD-10-CM | POA: Diagnosis not present

## 2021-08-13 DIAGNOSIS — M6281 Muscle weakness (generalized): Secondary | ICD-10-CM | POA: Diagnosis not present

## 2021-08-15 DIAGNOSIS — Z96611 Presence of right artificial shoulder joint: Secondary | ICD-10-CM | POA: Diagnosis not present

## 2021-08-15 DIAGNOSIS — M25611 Stiffness of right shoulder, not elsewhere classified: Secondary | ICD-10-CM | POA: Diagnosis not present

## 2021-08-15 DIAGNOSIS — M6281 Muscle weakness (generalized): Secondary | ICD-10-CM | POA: Diagnosis not present

## 2021-08-15 DIAGNOSIS — Z4789 Encounter for other orthopedic aftercare: Secondary | ICD-10-CM | POA: Diagnosis not present

## 2021-08-15 DIAGNOSIS — M25511 Pain in right shoulder: Secondary | ICD-10-CM | POA: Diagnosis not present

## 2021-08-19 DIAGNOSIS — Z4789 Encounter for other orthopedic aftercare: Secondary | ICD-10-CM | POA: Diagnosis not present

## 2021-08-19 DIAGNOSIS — M6281 Muscle weakness (generalized): Secondary | ICD-10-CM | POA: Diagnosis not present

## 2021-08-19 DIAGNOSIS — M25511 Pain in right shoulder: Secondary | ICD-10-CM | POA: Diagnosis not present

## 2021-08-19 DIAGNOSIS — Z96611 Presence of right artificial shoulder joint: Secondary | ICD-10-CM | POA: Diagnosis not present

## 2021-08-19 DIAGNOSIS — M25611 Stiffness of right shoulder, not elsewhere classified: Secondary | ICD-10-CM | POA: Diagnosis not present

## 2021-08-20 ENCOUNTER — Ambulatory Visit: Payer: Medicare Other

## 2021-08-21 DIAGNOSIS — M25611 Stiffness of right shoulder, not elsewhere classified: Secondary | ICD-10-CM | POA: Diagnosis not present

## 2021-08-21 DIAGNOSIS — M6281 Muscle weakness (generalized): Secondary | ICD-10-CM | POA: Diagnosis not present

## 2021-08-21 DIAGNOSIS — M25511 Pain in right shoulder: Secondary | ICD-10-CM | POA: Diagnosis not present

## 2021-08-21 DIAGNOSIS — Z4789 Encounter for other orthopedic aftercare: Secondary | ICD-10-CM | POA: Diagnosis not present

## 2021-08-21 DIAGNOSIS — Z96611 Presence of right artificial shoulder joint: Secondary | ICD-10-CM | POA: Diagnosis not present

## 2021-08-26 DIAGNOSIS — Z96611 Presence of right artificial shoulder joint: Secondary | ICD-10-CM | POA: Diagnosis not present

## 2021-08-26 DIAGNOSIS — M25511 Pain in right shoulder: Secondary | ICD-10-CM | POA: Diagnosis not present

## 2021-08-26 DIAGNOSIS — M25611 Stiffness of right shoulder, not elsewhere classified: Secondary | ICD-10-CM | POA: Diagnosis not present

## 2021-08-26 DIAGNOSIS — M6281 Muscle weakness (generalized): Secondary | ICD-10-CM | POA: Diagnosis not present

## 2021-08-26 DIAGNOSIS — Z4789 Encounter for other orthopedic aftercare: Secondary | ICD-10-CM | POA: Diagnosis not present

## 2021-08-28 DIAGNOSIS — M25511 Pain in right shoulder: Secondary | ICD-10-CM | POA: Diagnosis not present

## 2021-08-28 DIAGNOSIS — Z4789 Encounter for other orthopedic aftercare: Secondary | ICD-10-CM | POA: Diagnosis not present

## 2021-08-28 DIAGNOSIS — M6281 Muscle weakness (generalized): Secondary | ICD-10-CM | POA: Diagnosis not present

## 2021-08-28 DIAGNOSIS — Z23 Encounter for immunization: Secondary | ICD-10-CM | POA: Diagnosis not present

## 2021-08-28 DIAGNOSIS — Z96611 Presence of right artificial shoulder joint: Secondary | ICD-10-CM | POA: Diagnosis not present

## 2021-08-28 DIAGNOSIS — M25611 Stiffness of right shoulder, not elsewhere classified: Secondary | ICD-10-CM | POA: Diagnosis not present

## 2021-09-02 DIAGNOSIS — Z4789 Encounter for other orthopedic aftercare: Secondary | ICD-10-CM | POA: Diagnosis not present

## 2021-09-02 DIAGNOSIS — M25511 Pain in right shoulder: Secondary | ICD-10-CM | POA: Diagnosis not present

## 2021-09-02 DIAGNOSIS — M6281 Muscle weakness (generalized): Secondary | ICD-10-CM | POA: Diagnosis not present

## 2021-09-02 DIAGNOSIS — M25611 Stiffness of right shoulder, not elsewhere classified: Secondary | ICD-10-CM | POA: Diagnosis not present

## 2021-09-02 DIAGNOSIS — Z96611 Presence of right artificial shoulder joint: Secondary | ICD-10-CM | POA: Diagnosis not present

## 2021-09-04 DIAGNOSIS — M25511 Pain in right shoulder: Secondary | ICD-10-CM | POA: Diagnosis not present

## 2021-09-04 DIAGNOSIS — M6281 Muscle weakness (generalized): Secondary | ICD-10-CM | POA: Diagnosis not present

## 2021-09-04 DIAGNOSIS — M25611 Stiffness of right shoulder, not elsewhere classified: Secondary | ICD-10-CM | POA: Diagnosis not present

## 2021-09-04 DIAGNOSIS — Z96611 Presence of right artificial shoulder joint: Secondary | ICD-10-CM | POA: Diagnosis not present

## 2021-09-04 DIAGNOSIS — Z4789 Encounter for other orthopedic aftercare: Secondary | ICD-10-CM | POA: Diagnosis not present

## 2021-09-10 DIAGNOSIS — Z4789 Encounter for other orthopedic aftercare: Secondary | ICD-10-CM | POA: Diagnosis not present

## 2021-09-11 DIAGNOSIS — Z96611 Presence of right artificial shoulder joint: Secondary | ICD-10-CM | POA: Diagnosis not present

## 2021-09-11 DIAGNOSIS — M6281 Muscle weakness (generalized): Secondary | ICD-10-CM | POA: Diagnosis not present

## 2021-09-11 DIAGNOSIS — M25611 Stiffness of right shoulder, not elsewhere classified: Secondary | ICD-10-CM | POA: Diagnosis not present

## 2021-09-11 DIAGNOSIS — Z4789 Encounter for other orthopedic aftercare: Secondary | ICD-10-CM | POA: Diagnosis not present

## 2021-09-11 DIAGNOSIS — M25511 Pain in right shoulder: Secondary | ICD-10-CM | POA: Diagnosis not present

## 2021-09-18 ENCOUNTER — Ambulatory Visit: Payer: Medicare Other

## 2021-12-12 DIAGNOSIS — M25511 Pain in right shoulder: Secondary | ICD-10-CM | POA: Diagnosis not present

## 2022-02-21 ENCOUNTER — Ambulatory Visit
Admission: RE | Admit: 2022-02-21 | Discharge: 2022-02-21 | Disposition: A | Discharge status: Home / Self Care | Payer: Medicare Other | Source: Ambulatory Visit | Attending: Family Medicine | Admitting: Family Medicine

## 2022-02-21 ENCOUNTER — Inpatient Hospital Stay: Admission: RE | Admit: 2022-02-21 | Payer: Medicare Other | Source: Ambulatory Visit

## 2022-02-21 DIAGNOSIS — Z1231 Encounter for screening mammogram for malignant neoplasm of breast: Secondary | ICD-10-CM

## 2022-03-26 DIAGNOSIS — Z1211 Encounter for screening for malignant neoplasm of colon: Secondary | ICD-10-CM | POA: Diagnosis not present

## 2022-03-26 DIAGNOSIS — Z0000 Encounter for general adult medical examination without abnormal findings: Secondary | ICD-10-CM | POA: Diagnosis not present

## 2022-03-26 DIAGNOSIS — R030 Elevated blood-pressure reading, without diagnosis of hypertension: Secondary | ICD-10-CM | POA: Diagnosis not present

## 2022-03-26 DIAGNOSIS — E782 Mixed hyperlipidemia: Secondary | ICD-10-CM | POA: Diagnosis not present

## 2022-03-26 DIAGNOSIS — M19011 Primary osteoarthritis, right shoulder: Secondary | ICD-10-CM | POA: Diagnosis not present

## 2022-03-31 IMAGING — MG DIGITAL SCREENING BILAT W/ TOMO W/ CAD
6 of 10 series · 6 of 30 positions shown · non-contrast
Comparison: Previous exam(s).

CLINICAL DATA: Screening.

EXAM:
DIGITAL SCREENING BILATERAL MAMMOGRAM WITH TOMO AND CAD

[R MLO synth-2D (1 of 2)]
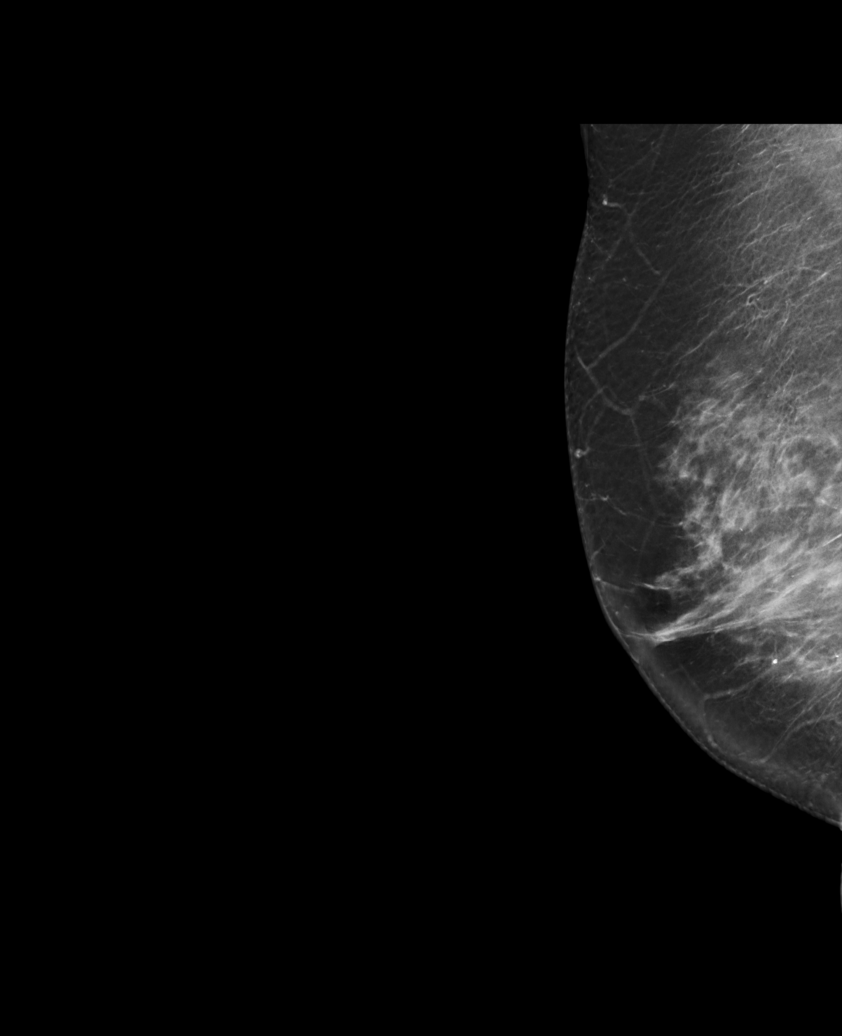

[R CC synth-2D]
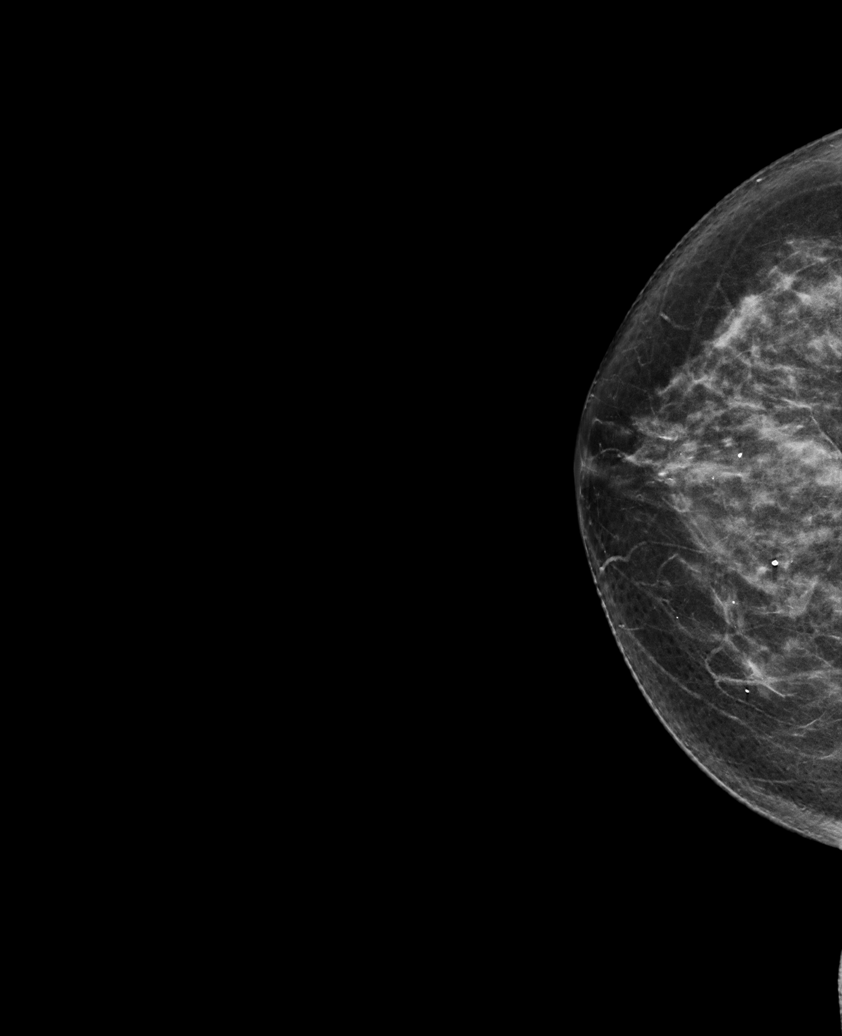

[L CC synth-2D]
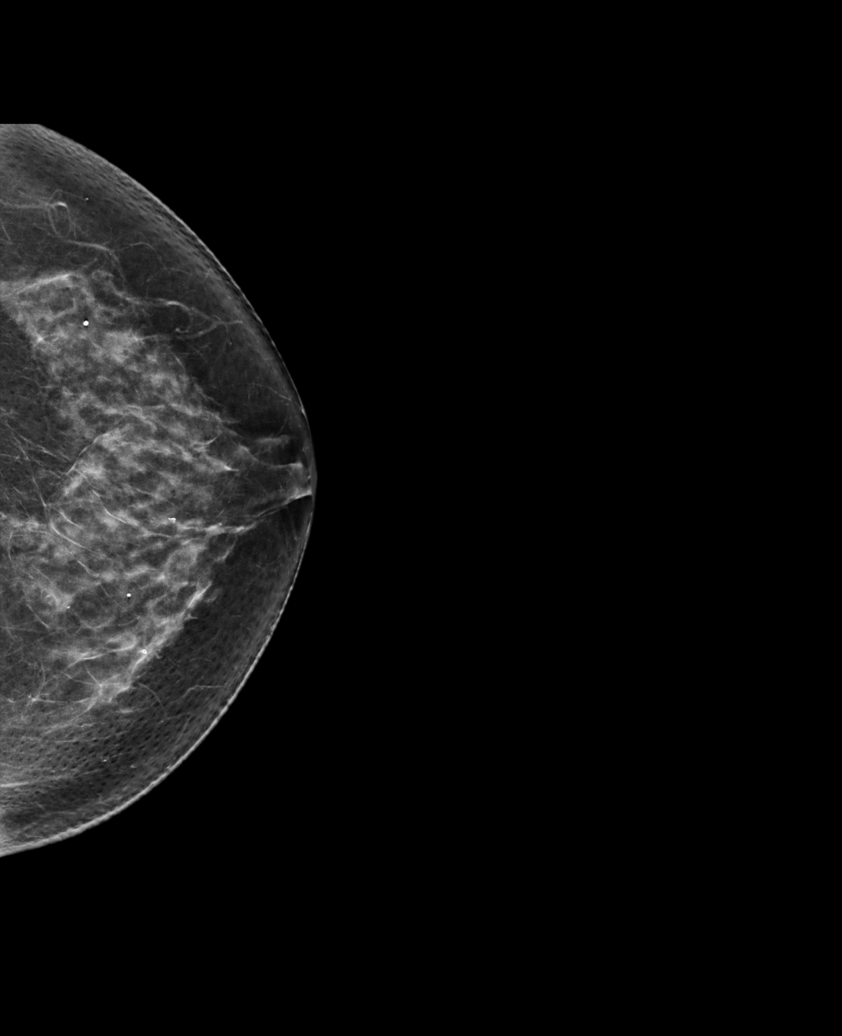

[R MLO synth-2D (2 of 2)]
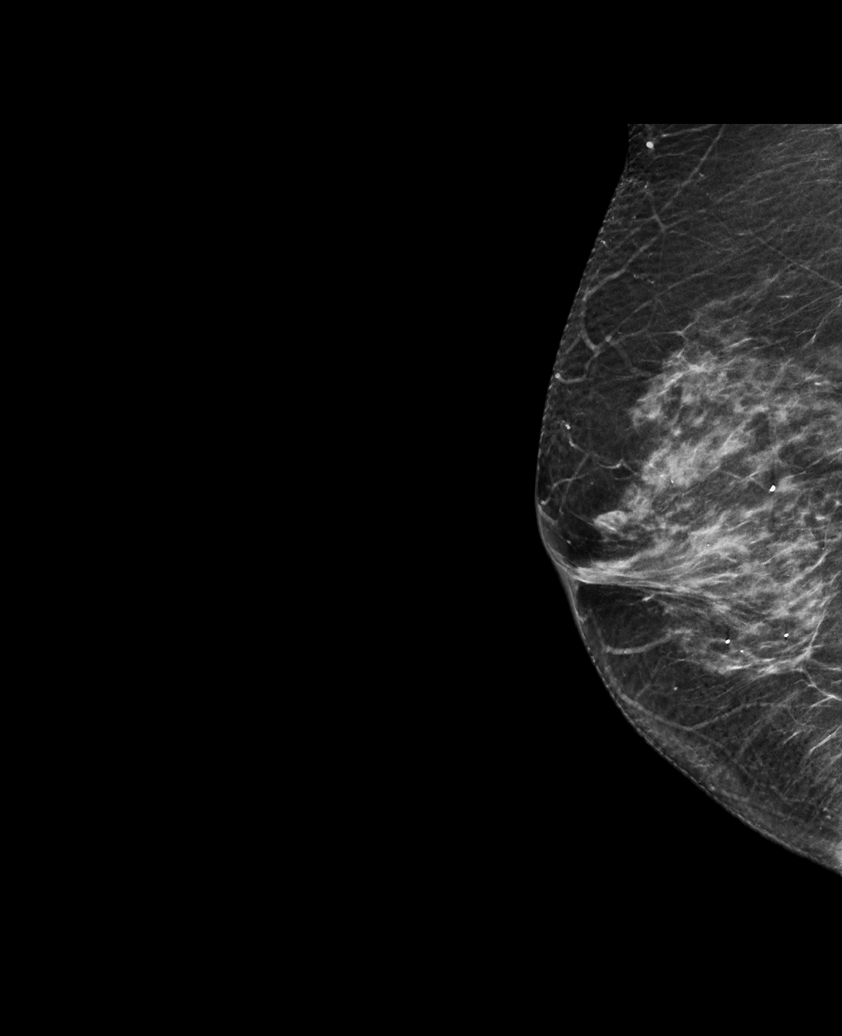

[L MLO synth-2D]
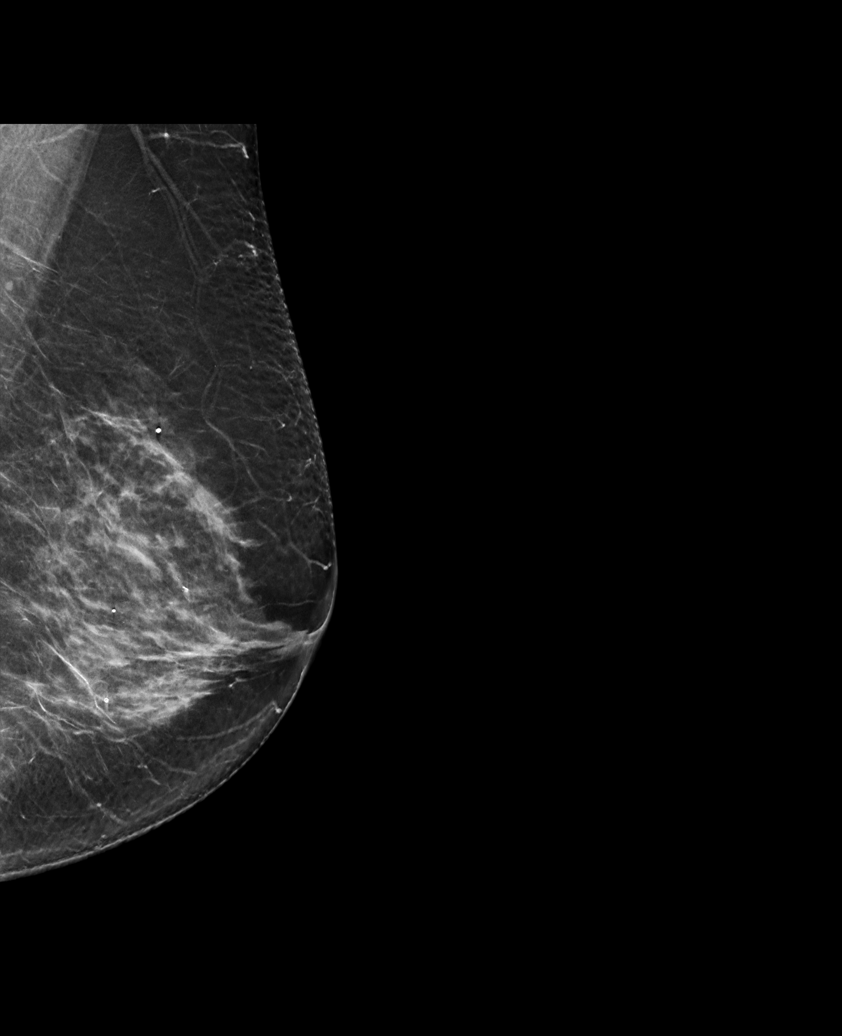

[L MLO tomo · tomo slice 39/77.0]
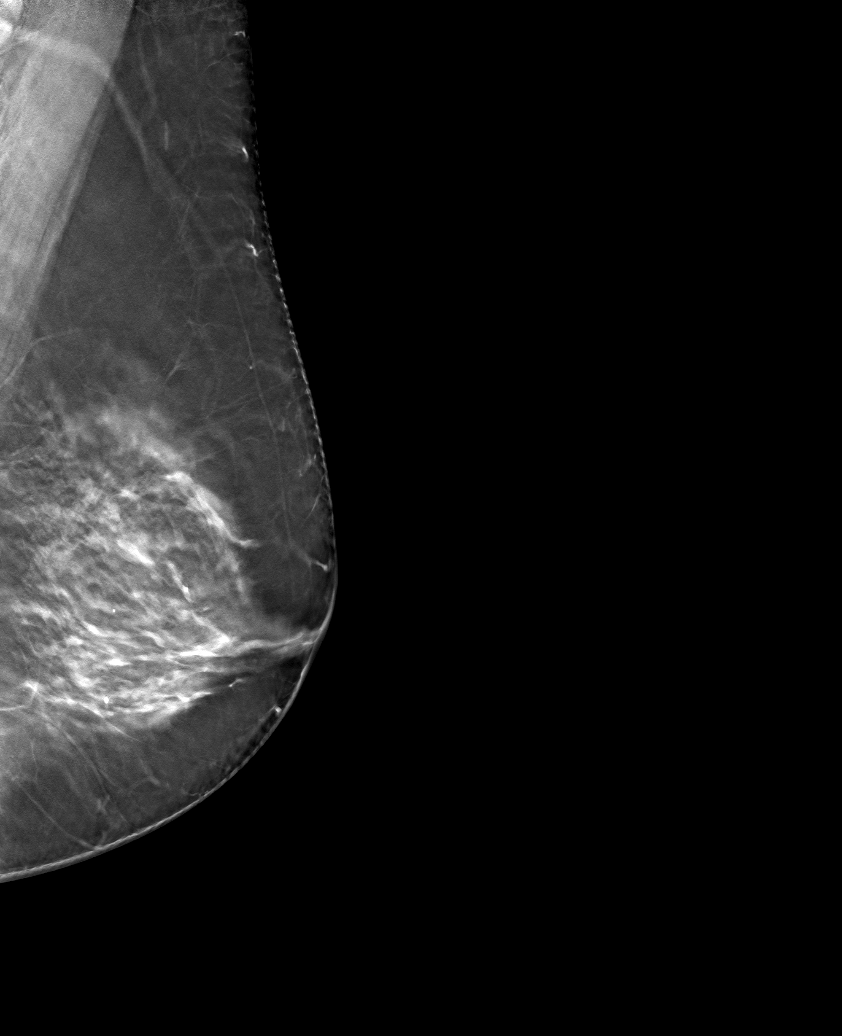

[6 of 30 positions shown; findings below may reference images not displayed]

ACR Breast Density Category c: The breast tissue is heterogeneously
dense, which may obscure small masses.
FINDINGS: There are no findings suspicious for malignancy. Images were
processed with CAD.
IMPRESSION: No mammographic evidence of malignancy. A result letter of this
screening mammogram will be mailed directly to the patient.

RECOMMENDATION:
Screening mammogram in one year. (Code:[id number]-U-LHB)

BI-RADS CATEGORY  1: Negative.

## 2022-04-17 DIAGNOSIS — Z961 Presence of intraocular lens: Secondary | ICD-10-CM | POA: Diagnosis not present

## 2022-04-17 DIAGNOSIS — H26493 Other secondary cataract, bilateral: Secondary | ICD-10-CM | POA: Diagnosis not present

## 2022-05-01 DIAGNOSIS — H26492 Other secondary cataract, left eye: Secondary | ICD-10-CM | POA: Diagnosis not present

## 2022-05-23 DIAGNOSIS — Z85820 Personal history of malignant melanoma of skin: Secondary | ICD-10-CM | POA: Diagnosis not present

## 2022-05-23 DIAGNOSIS — L821 Other seborrheic keratosis: Secondary | ICD-10-CM | POA: Diagnosis not present

## 2022-05-23 DIAGNOSIS — L570 Actinic keratosis: Secondary | ICD-10-CM | POA: Diagnosis not present

## 2022-05-23 DIAGNOSIS — X32XXXD Exposure to sunlight, subsequent encounter: Secondary | ICD-10-CM | POA: Diagnosis not present

## 2022-05-23 DIAGNOSIS — Z08 Encounter for follow-up examination after completed treatment for malignant neoplasm: Secondary | ICD-10-CM | POA: Diagnosis not present

## 2022-05-23 DIAGNOSIS — Z1283 Encounter for screening for malignant neoplasm of skin: Secondary | ICD-10-CM | POA: Diagnosis not present

## 2022-06-19 DIAGNOSIS — Z96611 Presence of right artificial shoulder joint: Secondary | ICD-10-CM | POA: Diagnosis not present

## 2022-06-19 DIAGNOSIS — Z471 Aftercare following joint replacement surgery: Secondary | ICD-10-CM | POA: Diagnosis not present

## 2022-08-22 DIAGNOSIS — Z23 Encounter for immunization: Secondary | ICD-10-CM | POA: Diagnosis not present

## 2022-09-19 DIAGNOSIS — I10 Essential (primary) hypertension: Secondary | ICD-10-CM | POA: Diagnosis not present

## 2022-09-19 DIAGNOSIS — E782 Mixed hyperlipidemia: Secondary | ICD-10-CM | POA: Diagnosis not present

## 2022-10-24 DIAGNOSIS — Z23 Encounter for immunization: Secondary | ICD-10-CM | POA: Diagnosis not present

## 2023-01-21 ENCOUNTER — Other Ambulatory Visit: Payer: Self-pay | Admitting: Family Medicine

## 2023-01-21 DIAGNOSIS — Z1231 Encounter for screening mammogram for malignant neoplasm of breast: Secondary | ICD-10-CM

## 2023-03-10 ENCOUNTER — Ambulatory Visit
Admission: RE | Admit: 2023-03-10 | Discharge: 2023-03-10 | Disposition: A | Discharge status: Home / Self Care | Payer: Medicare Other | Source: Ambulatory Visit | Attending: Family Medicine | Admitting: Family Medicine

## 2023-03-10 DIAGNOSIS — Z1231 Encounter for screening mammogram for malignant neoplasm of breast: Secondary | ICD-10-CM | POA: Diagnosis not present

## 2023-04-17 DIAGNOSIS — I10 Essential (primary) hypertension: Secondary | ICD-10-CM | POA: Diagnosis not present

## 2023-04-17 DIAGNOSIS — Z0000 Encounter for general adult medical examination without abnormal findings: Secondary | ICD-10-CM | POA: Diagnosis not present

## 2023-04-17 DIAGNOSIS — E782 Mixed hyperlipidemia: Secondary | ICD-10-CM | POA: Diagnosis not present

## 2023-04-17 DIAGNOSIS — Z1211 Encounter for screening for malignant neoplasm of colon: Secondary | ICD-10-CM | POA: Diagnosis not present

## 2023-04-28 ENCOUNTER — Other Ambulatory Visit: Payer: Self-pay | Admitting: Family Medicine

## 2023-04-28 DIAGNOSIS — Z780 Asymptomatic menopausal state: Secondary | ICD-10-CM

## 2023-05-26 ENCOUNTER — Other Ambulatory Visit: Payer: Self-pay | Admitting: Family Medicine

## 2023-05-26 DIAGNOSIS — Z780 Asymptomatic menopausal state: Secondary | ICD-10-CM

## 2023-05-28 DIAGNOSIS — Z961 Presence of intraocular lens: Secondary | ICD-10-CM | POA: Diagnosis not present

## 2023-05-28 DIAGNOSIS — H43812 Vitreous degeneration, left eye: Secondary | ICD-10-CM | POA: Diagnosis not present

## 2023-06-02 DIAGNOSIS — Z1283 Encounter for screening for malignant neoplasm of skin: Secondary | ICD-10-CM | POA: Diagnosis not present

## 2023-06-02 DIAGNOSIS — Z08 Encounter for follow-up examination after completed treatment for malignant neoplasm: Secondary | ICD-10-CM | POA: Diagnosis not present

## 2023-06-02 DIAGNOSIS — Z85820 Personal history of malignant melanoma of skin: Secondary | ICD-10-CM | POA: Diagnosis not present

## 2023-06-16 DIAGNOSIS — Z1211 Encounter for screening for malignant neoplasm of colon: Secondary | ICD-10-CM | POA: Diagnosis not present

## 2023-06-24 ENCOUNTER — Ambulatory Visit
Admission: RE | Admit: 2023-06-24 | Discharge: 2023-06-24 | Disposition: A | Discharge status: Home / Self Care | Payer: Medicare Other | Source: Ambulatory Visit | Attending: Family Medicine | Admitting: Family Medicine

## 2023-06-24 DIAGNOSIS — Z780 Asymptomatic menopausal state: Secondary | ICD-10-CM

## 2023-06-24 DIAGNOSIS — E349 Endocrine disorder, unspecified: Secondary | ICD-10-CM | POA: Diagnosis not present

## 2023-06-24 DIAGNOSIS — M8588 Other specified disorders of bone density and structure, other site: Secondary | ICD-10-CM | POA: Diagnosis not present

## 2023-06-24 DIAGNOSIS — N950 Postmenopausal bleeding: Secondary | ICD-10-CM | POA: Diagnosis not present

## 2023-07-10 DIAGNOSIS — M810 Age-related osteoporosis without current pathological fracture: Secondary | ICD-10-CM | POA: Diagnosis not present

## 2023-08-26 DIAGNOSIS — Z23 Encounter for immunization: Secondary | ICD-10-CM | POA: Diagnosis not present

## 2023-12-25 DIAGNOSIS — C031 Malignant neoplasm of lower gum: Secondary | ICD-10-CM | POA: Diagnosis not present

## 2024-01-11 DIAGNOSIS — I10 Essential (primary) hypertension: Secondary | ICD-10-CM | POA: Diagnosis not present

## 2024-01-11 DIAGNOSIS — C069 Malignant neoplasm of mouth, unspecified: Secondary | ICD-10-CM | POA: Diagnosis not present

## 2024-01-13 ENCOUNTER — Other Ambulatory Visit (HOSPITAL_COMMUNITY): Payer: Self-pay | Admitting: Otolaryngology

## 2024-01-13 DIAGNOSIS — C069 Malignant neoplasm of mouth, unspecified: Secondary | ICD-10-CM

## 2024-01-21 ENCOUNTER — Ambulatory Visit (HOSPITAL_COMMUNITY)
Admission: RE | Admit: 2024-01-21 | Discharge: 2024-01-21 | Disposition: A | Discharge status: Home / Self Care | Payer: Medicare Other | Source: Ambulatory Visit | Attending: Otolaryngology | Admitting: Otolaryngology

## 2024-01-21 DIAGNOSIS — C069 Malignant neoplasm of mouth, unspecified: Secondary | ICD-10-CM | POA: Diagnosis not present

## 2024-01-21 MED ORDER — IOHEXOL 350 MG/ML SOLN
75.0000 mL | Freq: Once | INTRAVENOUS | Status: AC | PRN
  Administered 2024-01-21: 75 mL via INTRAVENOUS

## 2024-02-16 ENCOUNTER — Other Ambulatory Visit: Payer: Self-pay | Admitting: Family Medicine

## 2024-02-16 DIAGNOSIS — Z1231 Encounter for screening mammogram for malignant neoplasm of breast: Secondary | ICD-10-CM

## 2024-03-10 DIAGNOSIS — Z03818 Encounter for observation for suspected exposure to other biological agents ruled out: Secondary | ICD-10-CM | POA: Diagnosis not present

## 2024-03-10 DIAGNOSIS — R051 Acute cough: Secondary | ICD-10-CM | POA: Diagnosis not present

## 2024-03-15 ENCOUNTER — Ambulatory Visit

## 2024-03-22 ENCOUNTER — Ambulatory Visit

## 2024-04-12 DIAGNOSIS — Z85818 Personal history of malignant neoplasm of other sites of lip, oral cavity, and pharynx: Secondary | ICD-10-CM | POA: Diagnosis not present

## 2024-04-12 DIAGNOSIS — Z08 Encounter for follow-up examination after completed treatment for malignant neoplasm: Secondary | ICD-10-CM | POA: Diagnosis not present

## 2024-04-19 ENCOUNTER — Ambulatory Visit
Admission: RE | Admit: 2024-04-19 | Discharge: 2024-04-19 | Disposition: A | Discharge status: Home / Self Care | Source: Ambulatory Visit | Attending: Family Medicine | Admitting: Family Medicine

## 2024-04-19 DIAGNOSIS — Z1231 Encounter for screening mammogram for malignant neoplasm of breast: Secondary | ICD-10-CM

## 2024-05-06 DIAGNOSIS — Z1331 Encounter for screening for depression: Secondary | ICD-10-CM | POA: Diagnosis not present

## 2024-05-06 DIAGNOSIS — Z6827 Body mass index (BMI) 27.0-27.9, adult: Secondary | ICD-10-CM | POA: Diagnosis not present

## 2024-05-06 DIAGNOSIS — M810 Age-related osteoporosis without current pathological fracture: Secondary | ICD-10-CM | POA: Diagnosis not present

## 2024-05-06 DIAGNOSIS — I10 Essential (primary) hypertension: Secondary | ICD-10-CM | POA: Diagnosis not present

## 2024-05-06 DIAGNOSIS — Z0000 Encounter for general adult medical examination without abnormal findings: Secondary | ICD-10-CM | POA: Diagnosis not present

## 2024-05-06 DIAGNOSIS — E782 Mixed hyperlipidemia: Secondary | ICD-10-CM | POA: Diagnosis not present

## 2024-05-06 DIAGNOSIS — C069 Malignant neoplasm of mouth, unspecified: Secondary | ICD-10-CM | POA: Diagnosis not present

## 2024-05-31 DIAGNOSIS — Z961 Presence of intraocular lens: Secondary | ICD-10-CM | POA: Diagnosis not present

## 2024-05-31 DIAGNOSIS — H43813 Vitreous degeneration, bilateral: Secondary | ICD-10-CM | POA: Diagnosis not present

## 2024-06-07 DIAGNOSIS — L820 Inflamed seborrheic keratosis: Secondary | ICD-10-CM | POA: Diagnosis not present

## 2024-06-07 DIAGNOSIS — Z08 Encounter for follow-up examination after completed treatment for malignant neoplasm: Secondary | ICD-10-CM | POA: Diagnosis not present

## 2024-06-07 DIAGNOSIS — X32XXXD Exposure to sunlight, subsequent encounter: Secondary | ICD-10-CM | POA: Diagnosis not present

## 2024-06-07 DIAGNOSIS — Z85820 Personal history of malignant melanoma of skin: Secondary | ICD-10-CM | POA: Diagnosis not present

## 2024-06-07 DIAGNOSIS — Z1283 Encounter for screening for malignant neoplasm of skin: Secondary | ICD-10-CM | POA: Diagnosis not present

## 2024-06-07 DIAGNOSIS — L570 Actinic keratosis: Secondary | ICD-10-CM | POA: Diagnosis not present

## 2024-06-07 DIAGNOSIS — D225 Melanocytic nevi of trunk: Secondary | ICD-10-CM | POA: Diagnosis not present

## 2024-07-19 ENCOUNTER — Emergency Department (HOSPITAL_BASED_OUTPATIENT_CLINIC_OR_DEPARTMENT_OTHER)
Admission: EM | Admit: 2024-07-19 | Discharge: 2024-07-19 | Disposition: A | Discharge status: Home / Self Care | Attending: Emergency Medicine | Admitting: Emergency Medicine

## 2024-07-19 ENCOUNTER — Emergency Department (HOSPITAL_BASED_OUTPATIENT_CLINIC_OR_DEPARTMENT_OTHER)

## 2024-07-19 ENCOUNTER — Encounter (HOSPITAL_BASED_OUTPATIENT_CLINIC_OR_DEPARTMENT_OTHER): Payer: Self-pay

## 2024-07-19 ENCOUNTER — Other Ambulatory Visit: Payer: Self-pay

## 2024-07-19 DIAGNOSIS — E861 Hypovolemia: Secondary | ICD-10-CM | POA: Diagnosis not present

## 2024-07-19 DIAGNOSIS — K449 Diaphragmatic hernia without obstruction or gangrene: Secondary | ICD-10-CM | POA: Diagnosis not present

## 2024-07-19 DIAGNOSIS — Z79899 Other long term (current) drug therapy: Secondary | ICD-10-CM | POA: Diagnosis not present

## 2024-07-19 DIAGNOSIS — R509 Fever, unspecified: Secondary | ICD-10-CM | POA: Diagnosis not present

## 2024-07-19 DIAGNOSIS — R109 Unspecified abdominal pain: Secondary | ICD-10-CM | POA: Diagnosis not present

## 2024-07-19 DIAGNOSIS — I10 Essential (primary) hypertension: Secondary | ICD-10-CM | POA: Insufficient documentation

## 2024-07-19 DIAGNOSIS — R000 Tachycardia, unspecified: Secondary | ICD-10-CM | POA: Insufficient documentation

## 2024-07-19 DIAGNOSIS — N12 Tubulo-interstitial nephritis, not specified as acute or chronic: Secondary | ICD-10-CM | POA: Insufficient documentation

## 2024-07-19 DIAGNOSIS — D72829 Elevated white blood cell count, unspecified: Secondary | ICD-10-CM | POA: Diagnosis not present

## 2024-07-19 DIAGNOSIS — R42 Dizziness and giddiness: Secondary | ICD-10-CM | POA: Diagnosis not present

## 2024-07-19 DIAGNOSIS — Z6826 Body mass index (BMI) 26.0-26.9, adult: Secondary | ICD-10-CM | POA: Diagnosis not present

## 2024-07-19 DIAGNOSIS — R1031 Right lower quadrant pain: Secondary | ICD-10-CM | POA: Diagnosis present

## 2024-07-19 DIAGNOSIS — K838 Other specified diseases of biliary tract: Secondary | ICD-10-CM | POA: Diagnosis not present

## 2024-07-19 HISTORY — DX: Essential (primary) hypertension: I10

## 2024-07-19 LAB — COMPREHENSIVE METABOLIC PANEL WITH GFR
ALT: 17 U/L (ref 0–44)
AST: 33 U/L (ref 15–41)
Albumin: 4.3 g/dL (ref 3.5–5.0)
Alkaline Phosphatase: 119 U/L (ref 38–126)
Anion gap: 14 (ref 5–15)
BUN: 15 mg/dL (ref 8–23)
CO2: 24 mmol/L (ref 22–32)
Calcium: 9.7 mg/dL (ref 8.9–10.3)
Chloride: 91 mmol/L — ABNORMAL LOW (ref 98–111)
Creatinine, Ser: 1.04 mg/dL — ABNORMAL HIGH (ref 0.44–1.00)
GFR, Estimated: 55 mL/min — ABNORMAL LOW (ref >60)
Glucose, Bld: 133 mg/dL — ABNORMAL HIGH (ref 70–99)
Potassium: 3.7 mmol/L (ref 3.5–5.1)
Sodium: 129 mmol/L — ABNORMAL LOW (ref 135–145)
Total Bilirubin: 1.1 mg/dL (ref 0.0–1.2)
Total Protein: 8.3 g/dL — ABNORMAL HIGH (ref 6.5–8.1)

## 2024-07-19 LAB — URINALYSIS, ROUTINE W REFLEX MICROSCOPIC
Bilirubin Urine: NEGATIVE
Glucose, UA: NEGATIVE mg/dL
Ketones, ur: NEGATIVE mg/dL
Nitrite: NEGATIVE
Protein, ur: 100 mg/dL — AB
Specific Gravity, Urine: 1.016 (ref 1.005–1.030)
Trans Epithel, UA: 8
WBC, UA: >50 WBC/hpf (ref 0–5)
pH: 5.5 (ref 5.0–8.0)

## 2024-07-19 LAB — CBC
HCT: 42.2 % (ref 36.0–46.0)
Hemoglobin: 14.3 g/dL (ref 12.0–15.0)
MCH: 28.9 pg (ref 26.0–34.0)
MCHC: 33.9 g/dL (ref 30.0–36.0)
MCV: 85.4 fL (ref 80.0–100.0)
Platelets: 313 K/uL (ref 150–400)
RBC: 4.94 MIL/uL (ref 3.87–5.11)
RDW: 13.9 % (ref 11.5–15.5)
WBC: 22.6 K/uL — ABNORMAL HIGH (ref 4.0–10.5)
nRBC: 0 % (ref 0.0–0.2)

## 2024-07-19 LAB — CBG MONITORING, ED: Glucose-Capillary: 123 mg/dL — ABNORMAL HIGH (ref 70–99)

## 2024-07-19 LAB — LIPASE, BLOOD: Lipase: 26 U/L (ref 11–51)

## 2024-07-19 MED ORDER — LACTATED RINGERS IV BOLUS
1000.0000 mL | Freq: Once | INTRAVENOUS | Status: AC
  Administered 2024-07-19: 1000 mL via INTRAVENOUS

## 2024-07-19 MED ORDER — SODIUM CHLORIDE 0.9 % IV SOLN
1.0000 g | Freq: Once | INTRAVENOUS | Status: AC
  Administered 2024-07-19: 1 g via INTRAVENOUS
  Filled 2024-07-19: qty 10

## 2024-07-19 MED ORDER — CEPHALEXIN 500 MG PO CAPS: 500.0000 mg | ORAL_CAPSULE | Freq: Four times a day (QID) | ORAL | 0 refills | Status: AC

## 2024-07-19 NOTE — ED Triage Notes (Signed)
 Reports feeling bad starting yesterday. Endorses pelvic pain, decreased appetite, 'running temperature' yesterday of 99.7, ongoing lightheadedness. Seen at PCP this morning, BP 86/54, sent here for further eval. Reports did not take meds this morning.

## 2024-07-19 NOTE — Discharge Instructions (Addendum)
 If you start having vomiting, inability to eat, generalized weakness and inability to get out of bed, confusion or symptoms not improving return to the emergency room.

## 2024-07-19 NOTE — ED Provider Notes (Signed)
 [place] EMERGENCY DEPARTMENT AT [facility] Provider Note   CSN: [id number] Arrival date & time: 07/19/24  1442     Patient presents with: Pelvic Pain and Dizziness   Kristin Richmond is a 78 y.o. female.  {Add pertinent medical, surgical, social history, OB history to HPI:32947} Patient is a 78 year old female with a history of hypertension, anemia and prior tubal ligation who is presenting today with complaints of lower abdominal pain, nausea, poor oral intake.  She reports that symptoms started on Sunday and have gradually worsened.  She has not had anything to eat for the last few days because of the nausea but denies any vomiting.  Last bowel movement was 2 days ago but was normal.  She reports just a lot of discomfort in her lower abdomen and a little bit in her left back.  She also noted fever and chills yesterday with a temperature of 99.  She got some AZO because she was concerned for urinary tract infection but it did not improve her symptoms.  She went to see her doctor today due to feeling unwell and at the doctor's office her blood pressure was in the 80s systolic.  She reports overall she is just feeling malaise but denies any shortness of breath, cough, chest pain or near syncope.  The history is provided by the patient.  Pelvic Pain  Dizziness      Prior to Admission medications   Medication Sig Start Date End Date Taking? Authorizing Provider  acetaminophen  (TYLENOL ) 325 MG tablet Take 325 mg by mouth every 6 (six) hours as needed (for pain).    [provider]  augmented betamethasone  dipropionate (DIPROLENE -AF) 0.05 % cream Apply 1 application topically 2 (two) times daily as needed (skin irritation (rash)). 05/22/21   [provider]  Calcium  Carb-Cholecalciferol (CALCIUM  + D3 PO) Take 1 tablet by mouth in the morning.    [provider]  ezetimibe  (ZETIA ) 10 MG tablet Take 10 mg by mouth in the morning. 12/11/20   [provider]  Menthol -Methyl Salicylate (SALONPAS PAIN RELIEF PATCH) PTCH Place 1 patch onto the skin daily as needed (pain).    [provider]  methocarbamol  (ROBAXIN ) 500 MG tablet Take 1 tablet (500 mg total) by mouth every 6 (six) hours as needed for muscle spasms. 06/14/21   Sharl Selinda Dover, MD  Multiple Vitamin (MULTIVITAMIN WITH MINERALS) TABS tablet Take 1 tablet by mouth in the morning.    [provider]  naproxen  (NAPROSYN ) 500 MG tablet Take 500 mg by mouth 2 (two) times daily. 04/20/21   [provider]    Allergies: Crestor [rosuvastatin]    Review of Systems  Genitourinary:  Positive for pelvic pain.  Neurological:  Positive for dizziness.    Updated Vital Signs BP 123/62   Pulse 100   Temp 98.1 F (36.7 C) (Oral)   Resp 18   Ht 5' 4.75 (1.645 m)   Wt 71.7 kg   SpO2 96%   BMI 26.50 kg/m   Physical Exam Vitals and nursing note reviewed.  Constitutional:      General: She is not in acute distress.    Appearance: She is well-developed.  HENT:     Head: Normocephalic and atraumatic.     Mouth/Throat:     Mouth: Mucous membranes are dry.  Eyes:     Conjunctiva/sclera: Conjunctivae normal.     Pupils: Pupils are equal, round, and reactive to light.  Cardiovascular:  Rate and Rhythm: Regular rhythm. Tachycardia present.     Pulses: Normal pulses.     Heart sounds: Murmur heard.     Systolic murmur is present with a grade of 2/6.  Pulmonary:     Effort: Pulmonary effort is normal. No respiratory distress.     Breath sounds: Normal breath sounds. No wheezing or rales.  Abdominal:     General: There is no distension.     Palpations: Abdomen is soft.     Tenderness: There is abdominal tenderness in the right lower quadrant, suprapubic area and left lower quadrant. There is left CVA tenderness. There is no guarding or rebound.  Musculoskeletal:        General: No tenderness. Normal range of motion.     Cervical back: Normal  range of motion and neck supple.  Skin:    General: Skin is warm and dry.     Findings: No erythema or rash.  Neurological:     Mental Status: She is alert and oriented to person, place, and time. Mental status is at baseline.  Psychiatric:        Behavior: Behavior normal.     (all labs ordered are listed, but only abnormal results are displayed) Labs Reviewed  COMPREHENSIVE METABOLIC PANEL WITH GFR - Abnormal; Notable for the following components:      Result Value   Sodium 129 (*)    Chloride 91 (*)    Glucose, Bld 133 (*)    Creatinine, Ser 1.04 (*)    Total Protein 8.3 (*)    GFR, Estimated 55 (*)    All other components within normal limits  CBC - Abnormal; Notable for the following components:   WBC 22.6 (*)    All other components within normal limits  URINALYSIS, ROUTINE W REFLEX MICROSCOPIC - Abnormal; Notable for the following components:   Color, Urine ORANGE (*)    APPearance CLOUDY (*)    Hgb urine dipstick MODERATE (*)    Protein, ur 100 (*)    Leukocytes,Ua LARGE (*)    Bacteria, UA MANY (*)    All other components within normal limits  LIPASE, BLOOD  CBG MONITORING, ED    EKG: None  Radiology: No results found.  {Document cardiac monitor, telemetry assessment procedure when appropriate:32947} Procedures   Medications Ordered in the ED  lactated ringers  bolus 1,000 mL (has no administration in time range)  cefTRIAXone  (ROCEPHIN ) 1 g in sodium chloride  0.9 % 100 mL IVPB (has no administration in time range)      {Click here for ABCD2, HEART and other calculators REFRESH Note before signing:1}                              Medical Decision Making Amount and/or Complexity of Data Reviewed Labs: ordered. Radiology: ordered.   Pt with multiple medical problems and comorbidities and presenting today with a complaint that caries a high risk for morbidity and mortality.  Here today with the above complaint.  Concern for UTI, pyelonephritis, renal  stone which is infected, diverticulitis, abdominal abscess.  Low suspicion for acute cardiac or respiratory causes.  Also concern for AKI and dehydration.  Here patient's vital signs are normal.  She is well-appearing on exam.  I independently interpreted patient's EKG and labs and UA today with moderate hemoglobin, leukocytes with 21-50 red cells and greater than 50 white cells with many bacteria.  CMP with hyponatremia of 129 and  creatinine of 1.04 with normal potassium and anion gap suspect mild dehydration, CBC with a leukocytosis of 22 and a stable hemoglobin of 14, lipase within normal limits.  EKG today with sinus tachycardia but no acute findings concerning for ACS.  Given patient's urine findings, more systemic symptoms could be pyelonephritis versus infected renal stone.  Will do a CT to further evaluate.  Patient was given Rocephin  and IV fluids.   {Document critical care time when appropriate  Document review of labs and clinical decision tools ie [id number], etc  Document your independent review of radiology images and any outside records  Document your discussion with family members, caretakers and with consultants  Document social determinants of health affecting pt's care  Document your decision making why or why not admission, treatments were needed:32947:::1}   Final diagnoses:  None    ED Discharge Orders     None

## 2024-07-22 LAB — URINE CULTURE: Culture: >=100000 — AB

## 2024-07-23 ENCOUNTER — Telehealth (HOSPITAL_BASED_OUTPATIENT_CLINIC_OR_DEPARTMENT_OTHER): Payer: Self-pay | Admitting: *Deleted

## 2024-07-23 NOTE — Telephone Encounter (Signed)
 Post ED Visit - Positive Culture Follow-up  Culture report reviewed by antimicrobial stewardship pharmacist: [facility] []  Rankin Dee, Pharm.D. []  Venetia Gully, Pharm.D., BCPS AQ-ID []  Garrel Crews, Pharm.D., BCPS []  Almarie Lunger, Pharm.D., BCPS []  [place], [place].D., BCPS, AAHIVP []  Rosaline Bihari, Pharm.D., BCPS, AAHIVP []  Vernell Meier, PharmD, BCPS []  Latanya Hint, PharmD, BCPS []  Donald Medley, PharmD, BCPS []  Rocky Bold, PharmD []  Dorothyann Alert, PharmD, BCPS [x]  Koren Or, PharmD  [facility] []  Rosaline Edison, PharmD []  Romona Bliss, PharmD []  Dolphus Roller, PharmD []  Veva Seip, Rph []  Vernell Daunt) Leonce, PharmD []  Eva Allis, PharmD []  Rosaline Millet, PharmD []  Iantha Batch, PharmD []  Arvin Gauss, PharmD []  Wanda Hasting, PharmD []  Ronal Rav, PharmD []  Rocky Slade, PharmD []  Bard Jeans, PharmD   Positive urine culture Treated with Cephalexin , organism sensitive to the same and no further patient follow-up is required at this time.  Kristin Richmond 07/23/2024, 11:41 AM

## 2024-08-02 DIAGNOSIS — N390 Urinary tract infection, site not specified: Secondary | ICD-10-CM | POA: Diagnosis not present

## 2024-08-15 DIAGNOSIS — Z23 Encounter for immunization: Secondary | ICD-10-CM | POA: Diagnosis not present

## 2024-08-15 DIAGNOSIS — C069 Malignant neoplasm of mouth, unspecified: Secondary | ICD-10-CM | POA: Diagnosis not present

## 2024-09-02 DIAGNOSIS — Z23 Encounter for immunization: Secondary | ICD-10-CM | POA: Diagnosis not present
